# Patient Record
Sex: Female | Born: 1935 | Race: White | Hispanic: No | Smoking: Never smoker
Health system: Southern US, Community
[De-identification: ages and names within clinical notes are randomized; demographics above are authoritative.]

## PROBLEM LIST (undated history)

## (undated) DIAGNOSIS — E876 Hypokalemia: Secondary | ICD-10-CM

## (undated) DIAGNOSIS — I10 Essential (primary) hypertension: Secondary | ICD-10-CM

## (undated) DIAGNOSIS — F0390 Unspecified dementia without behavioral disturbance: Secondary | ICD-10-CM

## (undated) DIAGNOSIS — G629 Polyneuropathy, unspecified: Secondary | ICD-10-CM

## (undated) DIAGNOSIS — F0280 Dementia in other diseases classified elsewhere without behavioral disturbance: Secondary | ICD-10-CM

## (undated) DIAGNOSIS — M1990 Unspecified osteoarthritis, unspecified site: Secondary | ICD-10-CM

## (undated) DIAGNOSIS — G309 Alzheimer's disease, unspecified: Secondary | ICD-10-CM

## (undated) DIAGNOSIS — E079 Disorder of thyroid, unspecified: Secondary | ICD-10-CM

---

## 1998-08-01 ENCOUNTER — Encounter: Payer: Self-pay | Admitting: Urology

## 1998-08-01 ENCOUNTER — Inpatient Hospital Stay (HOSPITAL_COMMUNITY): Admission: EM | Admit: 1998-08-01 | Discharge: 1998-08-06 | Payer: Self-pay | Admitting: *Deleted

## 1998-08-03 ENCOUNTER — Encounter: Payer: Self-pay | Admitting: Urology

## 1998-08-04 ENCOUNTER — Encounter: Payer: Self-pay | Admitting: Cardiovascular Disease

## 1998-08-06 ENCOUNTER — Encounter: Payer: Self-pay | Admitting: Interventional Cardiology

## 1998-09-12 ENCOUNTER — Encounter: Payer: Self-pay | Admitting: Gastroenterology

## 1998-09-12 ENCOUNTER — Ambulatory Visit (HOSPITAL_COMMUNITY): Admission: RE | Admit: 1998-09-12 | Discharge: 1998-09-12 | Payer: Self-pay | Admitting: Gastroenterology

## 1999-07-21 ENCOUNTER — Ambulatory Visit (HOSPITAL_COMMUNITY): Admission: RE | Admit: 1999-07-21 | Discharge: 1999-07-21 | Payer: Self-pay

## 1999-09-09 ENCOUNTER — Encounter: Payer: Self-pay | Admitting: Urology

## 1999-09-11 ENCOUNTER — Encounter: Payer: Self-pay | Admitting: Urology

## 1999-09-11 ENCOUNTER — Ambulatory Visit (HOSPITAL_COMMUNITY): Admission: RE | Admit: 1999-09-11 | Discharge: 1999-09-11 | Payer: Self-pay | Admitting: Urology

## 2000-11-01 ENCOUNTER — Ambulatory Visit (HOSPITAL_COMMUNITY): Admission: RE | Admit: 2000-11-01 | Discharge: 2000-11-01 | Payer: Self-pay | Admitting: Gastroenterology

## 2001-01-28 ENCOUNTER — Ambulatory Visit (HOSPITAL_COMMUNITY): Admission: RE | Admit: 2001-01-28 | Discharge: 2001-01-28 | Payer: Self-pay | Admitting: Orthopedic Surgery

## 2001-05-31 ENCOUNTER — Encounter: Admission: RE | Admit: 2001-05-31 | Discharge: 2001-05-31 | Payer: Self-pay | Admitting: Internal Medicine

## 2001-05-31 ENCOUNTER — Encounter: Payer: Self-pay | Admitting: Internal Medicine

## 2001-07-19 ENCOUNTER — Other Ambulatory Visit: Admission: RE | Admit: 2001-07-19 | Discharge: 2001-07-19 | Payer: Self-pay | Admitting: Gynecology

## 2001-09-27 ENCOUNTER — Emergency Department (HOSPITAL_COMMUNITY): Admission: EM | Admit: 2001-09-27 | Discharge: 2001-09-27 | Payer: Self-pay | Admitting: Emergency Medicine

## 2002-06-26 ENCOUNTER — Ambulatory Visit (HOSPITAL_COMMUNITY): Admission: RE | Admit: 2002-06-26 | Discharge: 2002-06-26 | Payer: Self-pay | Admitting: Gastroenterology

## 2003-07-25 ENCOUNTER — Other Ambulatory Visit: Admission: RE | Admit: 2003-07-25 | Discharge: 2003-07-25 | Payer: Self-pay | Admitting: Obstetrics and Gynecology

## 2003-11-13 ENCOUNTER — Emergency Department (HOSPITAL_COMMUNITY): Admission: EM | Admit: 2003-11-13 | Discharge: 2003-11-13 | Payer: Self-pay | Admitting: Emergency Medicine

## 2004-02-04 ENCOUNTER — Emergency Department: Payer: Self-pay | Admitting: Emergency Medicine

## 2004-02-04 ENCOUNTER — Other Ambulatory Visit: Payer: Self-pay

## 2004-05-08 ENCOUNTER — Encounter: Admission: RE | Admit: 2004-05-08 | Discharge: 2004-05-08 | Payer: Self-pay | Admitting: Internal Medicine

## 2004-12-07 ENCOUNTER — Emergency Department (HOSPITAL_COMMUNITY): Admission: EM | Admit: 2004-12-07 | Discharge: 2004-12-07 | Payer: Self-pay | Admitting: Emergency Medicine

## 2005-04-26 ENCOUNTER — Ambulatory Visit: Payer: Self-pay | Admitting: Physical Medicine & Rehabilitation

## 2005-04-26 ENCOUNTER — Inpatient Hospital Stay (HOSPITAL_COMMUNITY): Admission: EM | Admit: 2005-04-26 | Discharge: 2005-04-30 | Payer: Self-pay | Admitting: Emergency Medicine

## 2007-11-17 ENCOUNTER — Inpatient Hospital Stay: Payer: Self-pay | Admitting: *Deleted

## 2007-11-17 ENCOUNTER — Other Ambulatory Visit: Payer: Self-pay

## 2009-01-31 ENCOUNTER — Emergency Department: Payer: Self-pay | Admitting: Unknown Physician Specialty

## 2009-12-30 ENCOUNTER — Inpatient Hospital Stay: Payer: Self-pay | Admitting: *Deleted

## 2010-07-14 ENCOUNTER — Emergency Department: Payer: Self-pay | Admitting: Emergency Medicine

## 2010-07-28 ENCOUNTER — Ambulatory Visit: Payer: Self-pay | Admitting: Otolaryngology

## 2010-08-25 ENCOUNTER — Emergency Department: Payer: Self-pay | Admitting: Emergency Medicine

## 2011-03-24 ENCOUNTER — Emergency Department: Payer: Self-pay | Admitting: Unknown Physician Specialty

## 2011-09-09 ENCOUNTER — Emergency Department: Payer: Self-pay

## 2011-09-09 LAB — URINALYSIS, COMPLETE
Bilirubin,UR: NEGATIVE
Blood: NEGATIVE
Ketone: NEGATIVE
Leukocyte Esterase: NEGATIVE
Nitrite: NEGATIVE
Ph: 6 (ref 4.5–8.0)
Protein: NEGATIVE
RBC,UR: <1 /HPF (ref 0–5)
Specific Gravity: 1.019 (ref 1.003–1.030)
Squamous Epithelial: 4
WBC UR: 2 /HPF (ref 0–5)

## 2011-09-10 ENCOUNTER — Emergency Department: Payer: Self-pay | Admitting: Internal Medicine

## 2011-09-10 LAB — COMPREHENSIVE METABOLIC PANEL
Anion Gap: 10 (ref 7–16)
Calcium, Total: 9 mg/dL (ref 8.5–10.1)
Creatinine: 1.21 mg/dL (ref 0.60–1.30)
Osmolality: 275 (ref 275–301)
Potassium: 3.1 mmol/L — ABNORMAL LOW (ref 3.5–5.1)
SGOT(AST): 42 U/L — ABNORMAL HIGH (ref 15–37)
SGPT (ALT): 30 U/L
Sodium: 135 mmol/L — ABNORMAL LOW (ref 136–145)
Total Protein: 7.1 g/dL (ref 6.4–8.2)

## 2011-09-10 LAB — URINALYSIS, COMPLETE
Bilirubin,UR: NEGATIVE
Blood: NEGATIVE
Glucose,UR: NEGATIVE mg/dL (ref 0–75)
Ketone: NEGATIVE
Protein: NEGATIVE
Renal Epithelial: <1
Specific Gravity: 1.017 (ref 1.003–1.030)
Squamous Epithelial: 2
Transitional Epi: 2
WBC UR: 2 /HPF (ref 0–5)

## 2011-09-10 LAB — CBC
HCT: 40.3 % (ref 35.0–47.0)
HGB: 13.8 g/dL (ref 12.0–16.0)
MCH: 30.9 pg (ref 26.0–34.0)
MCHC: 34.2 g/dL (ref 32.0–36.0)
MCV: 90 fL (ref 80–100)
Platelet: 175 10*3/uL (ref 150–440)
WBC: 9.1 10*3/uL (ref 3.6–11.0)

## 2012-05-17 ENCOUNTER — Emergency Department (HOSPITAL_COMMUNITY)
Admission: EM | Admit: 2012-05-17 | Discharge: 2012-05-17 | Disposition: A | Discharge status: Home / Self Care | Payer: Medicare Other | Attending: Emergency Medicine | Admitting: Emergency Medicine

## 2012-05-17 ENCOUNTER — Encounter (HOSPITAL_COMMUNITY): Payer: Self-pay | Admitting: *Deleted

## 2012-05-17 ENCOUNTER — Emergency Department (HOSPITAL_COMMUNITY): Payer: Medicare Other

## 2012-05-17 DIAGNOSIS — Z862 Personal history of diseases of the blood and blood-forming organs and certain disorders involving the immune mechanism: Secondary | ICD-10-CM | POA: Insufficient documentation

## 2012-05-17 DIAGNOSIS — I10 Essential (primary) hypertension: Secondary | ICD-10-CM | POA: Insufficient documentation

## 2012-05-17 DIAGNOSIS — S41119A Laceration without foreign body of unspecified upper arm, initial encounter: Secondary | ICD-10-CM

## 2012-05-17 DIAGNOSIS — G609 Hereditary and idiopathic neuropathy, unspecified: Secondary | ICD-10-CM | POA: Insufficient documentation

## 2012-05-17 DIAGNOSIS — Y9210 Unspecified residential institution as the place of occurrence of the external cause: Secondary | ICD-10-CM | POA: Insufficient documentation

## 2012-05-17 DIAGNOSIS — S0003XA Contusion of scalp, initial encounter: Secondary | ICD-10-CM | POA: Insufficient documentation

## 2012-05-17 DIAGNOSIS — W010XXA Fall on same level from slipping, tripping and stumbling without subsequent striking against object, initial encounter: Secondary | ICD-10-CM | POA: Insufficient documentation

## 2012-05-17 DIAGNOSIS — G309 Alzheimer's disease, unspecified: Secondary | ICD-10-CM | POA: Insufficient documentation

## 2012-05-17 DIAGNOSIS — Z8639 Personal history of other endocrine, nutritional and metabolic disease: Secondary | ICD-10-CM | POA: Insufficient documentation

## 2012-05-17 DIAGNOSIS — S41109A Unspecified open wound of unspecified upper arm, initial encounter: Secondary | ICD-10-CM | POA: Insufficient documentation

## 2012-05-17 DIAGNOSIS — W19XXXA Unspecified fall, initial encounter: Secondary | ICD-10-CM

## 2012-05-17 DIAGNOSIS — S1093XA Contusion of unspecified part of neck, initial encounter: Secondary | ICD-10-CM | POA: Insufficient documentation

## 2012-05-17 DIAGNOSIS — E079 Disorder of thyroid, unspecified: Secondary | ICD-10-CM | POA: Insufficient documentation

## 2012-05-17 DIAGNOSIS — Z79899 Other long term (current) drug therapy: Secondary | ICD-10-CM | POA: Insufficient documentation

## 2012-05-17 DIAGNOSIS — IMO0002 Reserved for concepts with insufficient information to code with codable children: Secondary | ICD-10-CM | POA: Insufficient documentation

## 2012-05-17 DIAGNOSIS — S0083XA Contusion of other part of head, initial encounter: Secondary | ICD-10-CM

## 2012-05-17 DIAGNOSIS — Z8739 Personal history of other diseases of the musculoskeletal system and connective tissue: Secondary | ICD-10-CM | POA: Insufficient documentation

## 2012-05-17 DIAGNOSIS — Y9389 Activity, other specified: Secondary | ICD-10-CM | POA: Insufficient documentation

## 2012-05-17 DIAGNOSIS — F0280 Dementia in other diseases classified elsewhere without behavioral disturbance: Secondary | ICD-10-CM | POA: Insufficient documentation

## 2012-05-17 HISTORY — DX: Unspecified osteoarthritis, unspecified site: M19.90

## 2012-05-17 HISTORY — DX: Dementia in other diseases classified elsewhere, unspecified severity, without behavioral disturbance, psychotic disturbance, mood disturbance, and anxiety: F02.80

## 2012-05-17 HISTORY — DX: Alzheimer's disease, unspecified: G30.9

## 2012-05-17 HISTORY — DX: Hypokalemia: E87.6

## 2012-05-17 HISTORY — DX: Unspecified dementia, unspecified severity, without behavioral disturbance, psychotic disturbance, mood disturbance, and anxiety: F03.90

## 2012-05-17 HISTORY — DX: Essential (primary) hypertension: I10

## 2012-05-17 HISTORY — DX: Disorder of thyroid, unspecified: E07.9

## 2012-05-17 HISTORY — DX: Polyneuropathy, unspecified: G62.9

## 2012-05-17 LAB — COMPREHENSIVE METABOLIC PANEL
AST: 20 U/L (ref 0–37)
Albumin: 2.8 g/dL — ABNORMAL LOW (ref 3.5–5.2)
Chloride: 106 mEq/L (ref 96–112)
Creatinine, Ser: 0.8 mg/dL (ref 0.50–1.10)
Potassium: 4.5 mEq/L (ref 3.5–5.1)
Total Bilirubin: 0.2 mg/dL — ABNORMAL LOW (ref 0.3–1.2)

## 2012-05-17 LAB — CBC WITH DIFFERENTIAL/PLATELET
Basophils Absolute: 0 10*3/uL (ref 0.0–0.1)
Basophils Relative: 0 % (ref 0–1)
MCHC: 33.2 g/dL (ref 30.0–36.0)
Neutro Abs: 8.2 10*3/uL — ABNORMAL HIGH (ref 1.7–7.7)
Neutrophils Relative %: 84 % — ABNORMAL HIGH (ref 43–77)
Platelets: 201 10*3/uL (ref 150–400)
RDW: 14.2 % (ref 11.5–15.5)

## 2012-05-17 NOTE — ED Notes (Signed)
[facility] called to take pt back to [facility].  

## 2012-05-17 NOTE — ED Provider Notes (Signed)
History     CSN: [id number]  Arrival date & time 05/17/12  1332   First MD Initiated Contact with Patient 05/17/12 1502      Chief Complaint  Patient presents with  . Fall    (Consider location/radiation/quality/duration/timing/severity/associated sxs/prior treatment) Patient is a 79 y.o. female presenting with fall. The history is provided by the patient and the nursing home.  Fall The accident occurred yesterday. Incident: pt states she tripped and fell and hit her head. She fell from a height of 1 to 2 ft. She landed on a hard floor. There was no blood loss. Point of impact: right arm and left side of face. The pain is at a severity of 3/10. The pain is mild. She was ambulatory at the scene. Pertinent negatives include no visual change, no numbness, no abdominal pain, no nausea, no vomiting, no headaches, no loss of consciousness and no tingling. The symptoms are aggravated by use of the injured limb. She has tried nothing for the symptoms. The treatment provided no relief.    Past Medical History  Diagnosis Date  . Dementia   . Alzheimer's dementia   . Thyroid disease   . Hypertension   . Hypokalemia   . DJD (degenerative joint disease)   . Peripheral neuropathy     History reviewed. No pertinent past surgical history.  History reviewed. No pertinent family history.  History  Substance Use Topics  . Smoking status: Never Smoker   . Smokeless tobacco: Never Used  . Alcohol Use: No    OB History    Grav Para Term Preterm Abortions TAB SAB Ect Mult Living                  Review of Systems  Gastrointestinal: Negative for nausea, vomiting and abdominal pain.  Neurological: Negative for tingling, loss of consciousness, numbness and headaches.  All other systems reviewed and are negative.    Allergies  Review of patient's allergies indicates no known allergies.  Home Medications   Current Outpatient Rx  Name  Route  Sig  Dispense  Refill  . ACETAMINOPHEN  500 MG PO TABS   Oral   Take 500 mg by mouth every 6 (six) hours as needed. PAIN         . ATENOLOL 50 MG PO TABS   Oral   Take 50 mg by mouth daily.         Marland Kitchen CETIRIZINE HCL 10 MG PO TABS   Oral   Take 10 mg by mouth daily.         Marland Kitchen DIVALPROEX SODIUM 125 MG PO TBEC   Oral   Take 125-250 mg by mouth daily. Pt takes 1 tablet every morning and 2 tablets every evening         . ESCITALOPRAM OXALATE 10 MG PO TABS   Oral   Take 10 mg by mouth daily.         Marland Kitchen GABAPENTIN 600 MG PO TABS   Oral   Take 600 mg by mouth daily.         Marland Kitchen LEVOTHYROXINE SODIUM 50 MCG PO TABS   Oral   Take 50 mcg by mouth daily.         Marland Kitchen OMEPRAZOLE 20 MG PO CPDR   Oral   Take 20 mg by mouth daily.         Marland Kitchen PREDNISONE 20 MG PO TABS   Oral   Take 20 mg by mouth daily. Pt  to take for 4 days.         Marland Kitchen PRESCRIPTION MEDICATION   Topical   Apply 1 application topically 2 (two) times daily. TAC 0.025% EUCERIN 1:1         . RIVASTIGMINE 4.6 MG/24HR TD PT24   Transdermal   Place 1 patch onto the skin daily.         . SENNA-DOCUSATE SODIUM 8.6-50 MG PO TABS   Oral   Take 1 tablet by mouth daily.         . SULFAMETHOXAZOLE-TRIMETHOPRIM 800-160 MG PO TABS   Oral   Take 1 tablet by mouth 2 (two) times daily. FOR 10 DAYS. FINISHED ON 05-08-12         . TRAZODONE HCL 50 MG PO TABS   Oral   Take 25 mg by mouth at bedtime.           BP 131/45  Pulse 61  Temp 98.1 F (36.7 C) (Oral)  Resp 16  SpO2 100%  Physical Exam  Nursing note and vitals reviewed. Constitutional: She is oriented to person, place, and time. She appears well-developed and well-nourished. No distress.  HENT:  Head: Normocephalic. Head is with contusion.    Right Ear: Tympanic membrane and ear canal normal.  Left Ear: Tympanic membrane and ear canal normal.  Mouth/Throat: Oropharynx is clear and moist.  Eyes: Conjunctivae normal and EOM are normal. Pupils are equal, round, and reactive to light.   Neck: Normal range of motion. Neck supple.  Cardiovascular: Normal rate, regular rhythm and intact distal pulses.   No murmur heard. Pulmonary/Chest: Effort normal and breath sounds normal. No respiratory distress. She has no wheezes. She has no rales.  Abdominal: Soft. She exhibits no distension. There is no tenderness. There is no rebound and no guarding.  Musculoskeletal: Normal range of motion. She exhibits tenderness. She exhibits no edema.       Arms: Neurological: She is alert and oriented to person, place, and time.  Skin: Skin is warm and dry. No rash noted. No erythema.  Psychiatric: She has a normal mood and affect. Her behavior is normal.    ED Course  Procedures (including critical care time)  Labs Reviewed  CBC WITH DIFFERENTIAL - Abnormal; Notable for the following:    Neutrophils Relative 84 (*)     Neutro Abs 8.2 (*)     Lymphocytes Relative 11 (*)     All other components within normal limits  COMPREHENSIVE METABOLIC PANEL - Abnormal; Notable for the following:    Glucose, Bld 141 (*)     Albumin 2.8 (*)     Total Bilirubin 0.2 (*)     GFR calc non Af Amer 63 (*)     GFR calc Af Amer 72 (*)     All other components within normal limits   Ct Head Wo Contrast  05/17/2012  *RADIOLOGY REPORT*  Clinical Data: Fall  CT HEAD WITHOUT CONTRAST  Technique:  Contiguous axial images were obtained from the base of the skull through the vertex without contrast.  Comparison: 12/07/2004  Findings: No skull fracture is noted.  There is focal scalp swelling left frontal region.  Small subcutaneous hematoma left scalp measures 1.4 cm.  No intracranial hemorrhage, mass effect or midline shift.  The paranasal sinuses and mastoid air cells are unremarkable.  Mild atherosclerotic calcifications of the carotid siphon.  Moderate cerebral atrophy.  Periventricular and subcortical white matter decreased attenuation is probable due to chronic small vessel  ischemic changes.  No acute infarction.   No mass lesion is noted on this unenhanced scan.  IMPRESSION:  1.  No acute intracranial abnormality. 2.  Scalp stranding and small subcutaneous hematoma in the left frontal scalp. 3.  Moderate cerebral atrophy.  Periventricular and subcortical white matter decreased attenuation probable due to chronic small vessel ischemic changes.   Original Report Authenticated By: Natasha Mead, M.D.      No diagnosis found.    MDM   Patient with a mechanical fall yesterday and sent him by the nursing home for evaluation. Patient has ecchymosis over her left eye but is awake and alert and otherwise acting normally. She has no pain in her neck and has a small skin tear to her right upper arm but the rest of exam is unrevealing for injury. She is not on anticoagulation and head CT is negative. Patient had labs drawn prior to my arrival which are normal. Will discharge patient home        Gwyneth Sprout, MD 05/17/12 1645

## 2012-05-17 NOTE — ED Notes (Signed)
[id number]> Expected date:<BR> Expected time:<BR> Means of arrival:Ambulance<BR> Comments:<BR> 89yof- fall/bruise to left eye

## 2012-05-17 NOTE — ED Notes (Signed)
Per EMS: Bethany Mcfarland last night, bump on left eye(face). No other complaints but pt is demented.

## 2012-05-17 NOTE — ED Notes (Signed)
Patient transported to CT 

## 2012-05-17 NOTE — Progress Notes (Signed)
CSW confirmed patient returning to [facility]. .No further Clinical Social Work needs, signing off.   Catha Gosselin, LCSWA  [phone] .05/17/2012 1725pm

## 2012-05-17 NOTE — ED Notes (Signed)
[facility] arrived to pick pt up.

## 2012-05-17 NOTE — ED Notes (Signed)
Attempted to call report to [facility], but got a recording on answering machine.

## 2012-06-19 ENCOUNTER — Emergency Department (HOSPITAL_COMMUNITY): Payer: Medicare Other

## 2012-06-19 ENCOUNTER — Encounter (HOSPITAL_COMMUNITY): Payer: Self-pay

## 2012-06-19 ENCOUNTER — Emergency Department (HOSPITAL_COMMUNITY)
Admission: EM | Admit: 2012-06-19 | Discharge: 2012-06-19 | Disposition: A | Discharge status: Skilled Nursing Facility | Payer: Medicare Other | Attending: Emergency Medicine | Admitting: Emergency Medicine

## 2012-06-19 DIAGNOSIS — Z8639 Personal history of other endocrine, nutritional and metabolic disease: Secondary | ICD-10-CM | POA: Insufficient documentation

## 2012-06-19 DIAGNOSIS — F0280 Dementia in other diseases classified elsewhere without behavioral disturbance: Secondary | ICD-10-CM | POA: Insufficient documentation

## 2012-06-19 DIAGNOSIS — W010XXA Fall on same level from slipping, tripping and stumbling without subsequent striking against object, initial encounter: Secondary | ICD-10-CM | POA: Insufficient documentation

## 2012-06-19 DIAGNOSIS — G309 Alzheimer's disease, unspecified: Secondary | ICD-10-CM | POA: Insufficient documentation

## 2012-06-19 DIAGNOSIS — Y9301 Activity, walking, marching and hiking: Secondary | ICD-10-CM | POA: Insufficient documentation

## 2012-06-19 DIAGNOSIS — Z8739 Personal history of other diseases of the musculoskeletal system and connective tissue: Secondary | ICD-10-CM | POA: Insufficient documentation

## 2012-06-19 DIAGNOSIS — I10 Essential (primary) hypertension: Secondary | ICD-10-CM | POA: Insufficient documentation

## 2012-06-19 DIAGNOSIS — S82899A Other fracture of unspecified lower leg, initial encounter for closed fracture: Secondary | ICD-10-CM | POA: Insufficient documentation

## 2012-06-19 DIAGNOSIS — Z862 Personal history of diseases of the blood and blood-forming organs and certain disorders involving the immune mechanism: Secondary | ICD-10-CM | POA: Insufficient documentation

## 2012-06-19 DIAGNOSIS — Y929 Unspecified place or not applicable: Secondary | ICD-10-CM | POA: Insufficient documentation

## 2012-06-19 DIAGNOSIS — E039 Hypothyroidism, unspecified: Secondary | ICD-10-CM | POA: Insufficient documentation

## 2012-06-19 DIAGNOSIS — Z8669 Personal history of other diseases of the nervous system and sense organs: Secondary | ICD-10-CM | POA: Insufficient documentation

## 2012-06-19 DIAGNOSIS — IMO0002 Reserved for concepts with insufficient information to code with codable children: Secondary | ICD-10-CM | POA: Insufficient documentation

## 2012-06-19 DIAGNOSIS — Z79899 Other long term (current) drug therapy: Secondary | ICD-10-CM | POA: Insufficient documentation

## 2012-06-19 DIAGNOSIS — S59909A Unspecified injury of unspecified elbow, initial encounter: Secondary | ICD-10-CM | POA: Insufficient documentation

## 2012-06-19 DIAGNOSIS — S6990XA Unspecified injury of unspecified wrist, hand and finger(s), initial encounter: Secondary | ICD-10-CM | POA: Insufficient documentation

## 2012-06-19 LAB — URINALYSIS, ROUTINE W REFLEX MICROSCOPIC
Ketones, ur: NEGATIVE mg/dL
Leukocytes, UA: NEGATIVE
Nitrite: NEGATIVE
Specific Gravity, Urine: 1.008 (ref 1.005–1.030)
pH: 6.5 (ref 5.0–8.0)

## 2012-06-19 LAB — POCT I-STAT, CHEM 8
Calcium, Ion: 1.19 mmol/L (ref 1.13–1.30)
Chloride: 106 mEq/L (ref 96–112)
HCT: 37 % (ref 36.0–46.0)
Potassium: 4 mEq/L (ref 3.5–5.1)
Sodium: 140 mEq/L (ref 135–145)

## 2012-06-19 LAB — CBC
MCH: 30.4 pg (ref 26.0–34.0)
MCV: 91.5 fL (ref 78.0–100.0)
Platelets: 164 10*3/uL (ref 150–400)
RBC: 3.98 MIL/uL (ref 3.87–5.11)

## 2012-06-19 MED ORDER — HYDROCODONE-ACETAMINOPHEN 5-325 MG PO TABS: 1.0000 | ORAL_TABLET | Freq: Four times a day (QID) | ORAL | Status: DC | PRN

## 2012-06-19 MED ORDER — FENTANYL CITRATE 0.05 MG/ML IJ SOLN
25.0000 ug | INTRAMUSCULAR | Status: DC | PRN
  Administered 2012-06-19: 25 ug via INTRAVENOUS
  Filled 2012-06-19: qty 2

## 2012-06-19 MED ORDER — ONDANSETRON HCL 4 MG/2ML IJ SOLN
4.0000 mg | Freq: Once | INTRAMUSCULAR | Status: AC
  Administered 2012-06-19: 4 mg via INTRAVENOUS
  Filled 2012-06-19: qty 2

## 2012-06-19 MED ORDER — SODIUM CHLORIDE 0.9 % IV SOLN: INTRAVENOUS | Status: DC

## 2012-06-19 NOTE — ED Notes (Addendum)
CALLED [facility]- ANSWERING machine came on. Will make [facility] aware. Day shift nurse will pass on report to ensure that no weight to left leg and must seek ortho service Dr Rayburn Ma on Monday for further treatment.

## 2012-06-19 NOTE — ED Notes (Signed)
Per EMS, pt from [facility].  Pt was ambulating to bathroom while urinating at same time.  Pt slipped in urine and fell to her bottom.  Fall unwitnessed.  Pt c/o pain in tailbone, left ankle, and has abrasion to left elbow.  No bumps/bruising to head.  LOC unknown d/t timeframe of alert.  No obvious head injury noted.  Vitals:  190/74, hr 66 (?irregular), resp 16, sats 99%.  HX HTN, Gerd, Dementia, hypothyroid (no blood thinners)

## 2012-06-19 NOTE — ED Notes (Signed)
[facility] [employer] notified of need for transport back to [facility], spoke w/ Harrold Donath. Informed pt would need to be non-weight bearing.

## 2012-06-19 NOTE — ED Notes (Signed)
ZOX:[id number]<AV> Expected date:<BR> Expected time: 2:30 AM<BR> Means of arrival:<BR> Comments:<BR> [age]F fall butt, ankle, elbow pain

## 2012-06-19 NOTE — ED Notes (Signed)
Dr. Opitz at bedside. 

## 2012-06-19 NOTE — ED Provider Notes (Signed)
History     CSN: [id number]  Arrival date & time 06/19/12  0234   First MD Initiated Contact with Patient 06/19/12 [phone]      Chief Complaint  Patient presents with  . Fall  . Tailbone Pain  . Ankle Pain    (Consider location/radiation/quality/duration/timing/severity/associated sxs/prior treatment) HPI History provided by EMS, nursing home report and patient. Resides in nursing facility. Was walking to the bathroom tonight slipped and injured her tailbone and left ankle. She also sustained abrasion to left elbow. She denies hitting her head or hurting her neck. Pain is mostly in her ankle is moderate in severity and sharp in quality. Patient is a limited historian with history of dementia.  She recalls falling and the incident tonight.   Past Medical History  Diagnosis Date  . Dementia   . Alzheimer's dementia   . Thyroid disease   . Hypertension   . Hypokalemia   . DJD (degenerative joint disease)   . Peripheral neuropathy     History reviewed. No pertinent past surgical history.  History reviewed. No pertinent family history.  History  Substance Use Topics  . Smoking status: Never Smoker   . Smokeless tobacco: Never Used  . Alcohol Use: No    OB History   Grav Para Term Preterm Abortions TAB SAB Ect Mult Living                  Review of Systems  Constitutional: Negative for fever and chills.  HENT: Negative for neck pain and neck stiffness.   Eyes: Negative for visual disturbance.  Respiratory: Negative for shortness of breath.   Cardiovascular: Negative for chest pain.  Gastrointestinal: Negative for abdominal pain.  Genitourinary: Negative for dysuria.  Musculoskeletal: Negative for back pain.  Skin: Negative for rash.  Neurological: Negative for headaches.  All other systems reviewed and are negative.    Allergies  Review of patient's allergies indicates no known allergies.  Home Medications   Current Outpatient Rx  Name  Route  Sig  Dispense   Refill  . atenolol (TENORMIN) 50 MG tablet   Oral   Take 50 mg by mouth every morning.          . divalproex (DEPAKOTE) 125 MG DR tablet   Oral   Take 125-250 mg by mouth daily. Take 1 tablet every morning and 2 tablets every evening         . escitalopram (LEXAPRO) 10 MG tablet   Oral   Take 10 mg by mouth every morning.          . gabapentin (NEURONTIN) 600 MG tablet   Oral   Take 600 mg by mouth every morning.          Marland Kitchen levothyroxine (SYNTHROID, LEVOTHROID) 50 MCG tablet   Oral   Take 50 mcg by mouth every morning.          Marland Kitchen omeprazole (PRILOSEC) 20 MG capsule   Oral   Take 20 mg by mouth daily.         . predniSONE (DELTASONE) 20 MG tablet   Oral   Take 20 mg by mouth every morning.          . rivastigmine (EXELON) 4.6 mg/24hr   Transdermal   Place 1 patch onto the skin every morning.          . traZODone (DESYREL) 50 MG tablet   Oral   Take 25 mg by mouth at bedtime.         Marland Kitchen  triamcinolone cream (KENALOG) 0.1 %   Topical   Apply 1 application topically every 8 (eight) hours. Apply to lower extremities every 8 hours           BP 182/45  Pulse 79  Temp(Src) 98 F (36.7 C) (Oral)  Resp 18  SpO2 96%  Physical Exam  Constitutional: She is oriented to person, place, and time. She appears well-developed and well-nourished.  HENT:  Head: Normocephalic and atraumatic.  Mouth/Throat: Oropharynx is clear and moist. No oropharyngeal exudate.  Eyes: EOM are normal. Pupils are equal, round, and reactive to light.  Neck: Neck supple.  No C-spine tenderness  Cardiovascular: Normal rate, regular rhythm and intact distal pulses.   Pulmonary/Chest: Effort normal and breath sounds normal. No respiratory distress. She exhibits no tenderness.  Abdominal: Soft. Bowel sounds are normal. She exhibits no distension. There is no tenderness.  Musculoskeletal:  Left elbow abrasion with full range of motion and distal neurovascular intact. Left ankle  swelling and tenderness to lateral malleolus with skin intact and distal neurovascular intact. No tenderness over knee, hip or proximal fibula.  Neurological: She is alert and oriented to person, place, and time.  Skin: Skin is warm and dry.    ED Course  Procedures (including critical care time)  Results for orders placed during the hospital encounter of 06/19/12  CBC      Result Value Range   WBC 8.9  4.0 - 10.5 K/uL   RBC 3.98  3.87 - 5.11 MIL/uL   Hemoglobin 12.1  12.0 - 15.0 g/dL   HCT 91.4  78.2 - 95.6 %   MCV 91.5  78.0 - 100.0 fL   MCH 30.4  26.0 - 34.0 pg   MCHC 33.2  30.0 - 36.0 g/dL   RDW 21.3  08.6 - 57.8 %   Platelets 164  150 - 400 K/uL  URINALYSIS, ROUTINE W REFLEX MICROSCOPIC      Result Value Range   Color, Urine YELLOW  YELLOW   APPearance CLOUDY (*) CLEAR   Specific Gravity, Urine 1.008  1.005 - 1.030   pH 6.5  5.0 - 8.0   Glucose, UA NEGATIVE  NEGATIVE mg/dL   Hgb urine dipstick NEGATIVE  NEGATIVE   Bilirubin Urine NEGATIVE  NEGATIVE   Ketones, ur NEGATIVE  NEGATIVE mg/dL   Protein, ur NEGATIVE  NEGATIVE mg/dL   Urobilinogen, UA 0.2  0.0 - 1.0 mg/dL   Nitrite NEGATIVE  NEGATIVE   Leukocytes, UA NEGATIVE  NEGATIVE  POCT I-STAT, CHEM 8      Result Value Range   Sodium 140  135 - 145 mEq/L   Potassium 4.0  3.5 - 5.1 mEq/L   Chloride 106  96 - 112 mEq/L   BUN 15  6 - 23 mg/dL   Creatinine, Ser 4.69  0.50 - 1.10 mg/dL   Glucose, Bld 90  70 - 99 mg/dL   Calcium, Ion 6.29  5.28 - 1.30 mmol/L   TCO2 23  0 - 100 mmol/L   Hemoglobin 12.6  12.0 - 15.0 g/dL   HCT 41.3  24.4 - 01.0 %   Dg Lumbar Spine Complete  06/19/2012  *RADIOLOGY REPORT*  Clinical Data: Low back pain after fall.  LUMBAR SPINE - COMPLETE 4+ VIEW  Comparison: 12/07/2004  Findings: Five lumbar type vertebrae.  Degenerative changes in the lumbar spine with narrowed lumbar interspaces and associated endplate hypertrophic changes.  Degenerative changes in the facet joints.  Normal alignment of the  lumbar vertebrae  and facet joints. No vertebral compression deformities.  No focal bone lesion or bone destruction.  Bone cortex and trabecular architecture appear intact.  Vascular calcifications.  IMPRESSION: Degenerative changes in the lumbar spine.  No displaced fractures identified.   Original Report Authenticated By: Burman Nieves, M.D.    Dg Elbow Complete Left  06/19/2012  *RADIOLOGY REPORT*  Clinical Data: Posterior laceration and pain after fall.  LEFT ELBOW - COMPLETE 3+ VIEW  Comparison: None.  Findings: Soft tissue defects consistent with lacerations posterior to the olecranon.  Small bone fragments over the olecranon process may represent avulsion fragments.  No significant effusion.  No displaced fractures of the elbow demonstrated.  No focal bone lesion or bone destruction.  IMPRESSION: Soft tissue lacerations over the olecranon region of the left elbow.  Tiny bone fragments off of the olecranon suggest avulsion fractures.  No displaced fracture or effusion.   Original Report Authenticated By: Burman Nieves, M.D.    Dg Ankle Complete Left  06/19/2012  *RADIOLOGY REPORT*  Clinical Data: Pain and swelling of the left ankle after fall.  LEFT ANKLE COMPLETE - 3+ VIEW  Comparison: None.  Findings: Diffuse soft tissue swelling about the left ankle, greatest laterally.  There is a mostly transverse fracture of the distal left fibula.  Transverse fracture of the medial malleolus. Small chip fracture off of the posterior malleolus.  There is slight widening of the medial aspect of the tibiotalar joint suggesting ligamentous injury.  Bone fragment suggested off of the lateral aspect of the calcaneus or cuboidal bone may also represent avulsion fracture.  Degenerative changes in the tarsal joints.  IMPRESSION: Trimalleolar fractures of the left ankle with widening of the medial tibiotalar joint space consistent with ligamentous injury. Probable avulsion fracture off of the lateral aspect of the  calcaneus or cuboidal bone.   Original Report Authenticated By: Burman Nieves, M.D.      Date: 06/19/2012  Rate: 70  Rhythm: normal sinus rhythm  QRS Axis: normal  Intervals: normal  ST/T Wave abnormalities: nonspecific ST changes  Conduction Disutrbances:none  Narrative Interpretation:   Old EKG Reviewed: unchanged   4:57 AM discussed with Dr. Magnus Ivan on call for orthopedics. Given history of dementia recommend well-padded splint and followup in the office on Monday.   On recheck pain is improved. Still for discharge back to nursing facility with no weightbearing to left lower extremity - followup orthopedic clinic  MDM   Fall at nursing facility with left ankle trimalar fracture, and very small avulsion fracture left elbow on imaging reviewed as above. Urinalysis and labs reviewed as above. EKG reviewed.  Vital signs and nursing notes reviewed - stable for discharge        Bethany Nielsen, MD 06/19/12 [phone]

## 2012-07-28 ENCOUNTER — Emergency Department (HOSPITAL_COMMUNITY)
Admission: EM | Admit: 2012-07-28 | Discharge: 2012-07-28 | Disposition: A | Discharge status: Skilled Nursing Facility | Payer: Medicare Other | Attending: Emergency Medicine | Admitting: Emergency Medicine

## 2012-07-28 ENCOUNTER — Emergency Department (HOSPITAL_COMMUNITY): Payer: Medicare Other

## 2012-07-28 ENCOUNTER — Encounter (HOSPITAL_COMMUNITY): Payer: Self-pay | Admitting: Emergency Medicine

## 2012-07-28 DIAGNOSIS — M25572 Pain in left ankle and joints of left foot: Secondary | ICD-10-CM

## 2012-07-28 DIAGNOSIS — Y9210 Unspecified residential institution as the place of occurrence of the external cause: Secondary | ICD-10-CM | POA: Insufficient documentation

## 2012-07-28 DIAGNOSIS — S99929A Unspecified injury of unspecified foot, initial encounter: Secondary | ICD-10-CM | POA: Insufficient documentation

## 2012-07-28 DIAGNOSIS — Y9389 Activity, other specified: Secondary | ICD-10-CM | POA: Insufficient documentation

## 2012-07-28 DIAGNOSIS — E079 Disorder of thyroid, unspecified: Secondary | ICD-10-CM | POA: Insufficient documentation

## 2012-07-28 DIAGNOSIS — M533 Sacrococcygeal disorders, not elsewhere classified: Secondary | ICD-10-CM | POA: Insufficient documentation

## 2012-07-28 DIAGNOSIS — W010XXA Fall on same level from slipping, tripping and stumbling without subsequent striking against object, initial encounter: Secondary | ICD-10-CM | POA: Insufficient documentation

## 2012-07-28 DIAGNOSIS — Z862 Personal history of diseases of the blood and blood-forming organs and certain disorders involving the immune mechanism: Secondary | ICD-10-CM | POA: Insufficient documentation

## 2012-07-28 DIAGNOSIS — F0280 Dementia in other diseases classified elsewhere without behavioral disturbance: Secondary | ICD-10-CM | POA: Insufficient documentation

## 2012-07-28 DIAGNOSIS — Z8639 Personal history of other endocrine, nutritional and metabolic disease: Secondary | ICD-10-CM | POA: Insufficient documentation

## 2012-07-28 DIAGNOSIS — I10 Essential (primary) hypertension: Secondary | ICD-10-CM | POA: Insufficient documentation

## 2012-07-28 DIAGNOSIS — S8990XA Unspecified injury of unspecified lower leg, initial encounter: Secondary | ICD-10-CM | POA: Insufficient documentation

## 2012-07-28 DIAGNOSIS — W19XXXA Unspecified fall, initial encounter: Secondary | ICD-10-CM

## 2012-07-28 DIAGNOSIS — G309 Alzheimer's disease, unspecified: Secondary | ICD-10-CM | POA: Insufficient documentation

## 2012-07-28 DIAGNOSIS — S82899A Other fracture of unspecified lower leg, initial encounter for closed fracture: Secondary | ICD-10-CM | POA: Insufficient documentation

## 2012-07-28 DIAGNOSIS — Z8739 Personal history of other diseases of the musculoskeletal system and connective tissue: Secondary | ICD-10-CM | POA: Insufficient documentation

## 2012-07-28 DIAGNOSIS — Z8669 Personal history of other diseases of the nervous system and sense organs: Secondary | ICD-10-CM | POA: Insufficient documentation

## 2012-07-28 DIAGNOSIS — Z79899 Other long term (current) drug therapy: Secondary | ICD-10-CM | POA: Insufficient documentation

## 2012-07-28 DIAGNOSIS — F0390 Unspecified dementia without behavioral disturbance: Secondary | ICD-10-CM | POA: Insufficient documentation

## 2012-07-28 NOTE — ED Notes (Signed)
[id number]<AV> Expected date:<BR> Expected time:<BR> Means of arrival:<BR> Comments:<BR> EMS-79 yo from SNF-fall trying to transfer to wheelchair-lower back pain/skin tears

## 2012-07-28 NOTE — ED Notes (Signed)
[facility]AR was called at this time.

## 2012-07-28 NOTE — ED Provider Notes (Signed)
History     CSN: [id number]  Arrival date & time 07/28/12  0112   First MD Initiated Contact with Patient 07/28/12 0124      Chief Complaint  Patient presents with  . Fall  . Ankle Pain    The history is provided by the patient.   patient was brought to the emergency department from her nursing home after a fall tonight.  She states she slipped and fell.  This occurred while she was transferring from bed to wheelchair.  She fell onto her bottom and complains of pain in the coccygeal region as well as her L-spine.  No cervical or thoracic pain reported.  No numbness or weakness of her upper lower extremities.  No head injury.  She also reports pain to her left ankle.  She was recently seen and evaluated and has a known left ankle fracture and she is post be in a fracture boot but she was not wearing this.  She denies chest pain shortness breath.  No abdominal pain.  No other complaints.  Pain is mild to moderate in severity at this time.  Past Medical History  Diagnosis Date  . Dementia   . Alzheimer's dementia   . Thyroid disease   . Hypertension   . Hypokalemia   . DJD (degenerative joint disease)   . Peripheral neuropathy     History reviewed. No pertinent past surgical history.  History reviewed. No pertinent family history.  History  Substance Use Topics  . Smoking status: Never Smoker   . Smokeless tobacco: Never Used  . Alcohol Use: No    OB History   Grav Para Term Preterm Abortions TAB SAB Ect Mult Living                  Review of Systems  All other systems reviewed and are negative.    Allergies  Review of patient's allergies indicates no known allergies.  Home Medications   Current Outpatient Rx  Name  Route  Sig  Dispense  Refill  . atenolol (TENORMIN) 50 MG tablet   Oral   Take 50 mg by mouth every morning.          . divalproex (DEPAKOTE) 125 MG DR tablet   Oral   Take 125-250 mg by mouth daily. Take 1 tablet every morning and 2 tablets  every evening         . escitalopram (LEXAPRO) 10 MG tablet   Oral   Take 10 mg by mouth every morning.          . gabapentin (NEURONTIN) 600 MG tablet   Oral   Take 600 mg by mouth every morning.          Marland Kitchen HYDROcodone-acetaminophen (NORCO/VICODIN) 5-325 MG per tablet   Oral   Take 1 tablet by mouth every 6 (six) hours as needed for pain.   6 tablet   0   . levothyroxine (SYNTHROID, LEVOTHROID) 50 MCG tablet   Oral   Take 50 mcg by mouth every morning.          Marland Kitchen lisinopril (PRINIVIL,ZESTRIL) 10 MG tablet   Oral   Take 10 mg by mouth daily.         Marland Kitchen omeprazole (PRILOSEC) 20 MG capsule   Oral   Take 40 mg by mouth daily.          . rivastigmine (EXELON) 4.6 mg/24hr   Transdermal   Place 1 patch onto the skin every morning.          Marland Kitchen  traZODone (DESYREL) 50 MG tablet   Oral   Take 25 mg by mouth at bedtime.         . triamcinolone cream (KENALOG) 0.1 %   Topical   Apply 1 application topically every 8 (eight) hours. Apply to lower extremities every 8 hours           BP 152/50  Temp(Src) 98.6 F (37 C) (Oral)  Resp 20  SpO2 100%  Physical Exam  Nursing note and vitals reviewed. Constitutional: She is oriented to person, place, and time. She appears well-developed and well-nourished. No distress.  HENT:  Head: Normocephalic and atraumatic.  Eyes: EOM are normal.  Neck: Normal range of motion.  Cardiovascular: Normal rate, regular rhythm and normal heart sounds.   Pulmonary/Chest: Effort normal and breath sounds normal.  Abdominal: Soft. She exhibits no distension. There is no tenderness.  Musculoskeletal: Normal range of motion.  Tenderness of left ankle without deformity noted.  Normal pulses in left foot.  Mild sacral coccygeal pain as well as mild tenderness to L-spine.  Neurological: She is alert and oriented to person, place, and time.  Skin: Skin is warm and dry.  Psychiatric: She has a normal mood and affect. Judgment normal.     ED Course  Procedures (including critical care time)  Labs Reviewed - No data to display Dg Lumbar Spine Complete  07/28/2012  *RADIOLOGY REPORT*  Clinical Data: Low back pain after fall.  LUMBAR SPINE - COMPLETE 4+ VIEW  Comparison: 06/19/2012  Findings: Five lumbar type vertebrae.  Slight anterior subluxation of L4 on L5 is stable since previous study and likely due to degenerative change.  Otherwise normal alignment of the lumbar vertebrae and facet joints.  Degenerative changes with narrowed lumbar interspaces and assisted endplate hypertrophic changes.  No vertebral compression deformities.  No focal bone lesion or bone destruction.  Bone cortex and trabecular architecture appear intact.  Vascular calcifications in the aorta.  No significant change since previous study.  IMPRESSION: Degenerative changes in the lumbar spine.  No displaced fractures identified.   Original Report Authenticated By: Burman Nieves, M.D.    Dg Sacrum/coccyx  07/28/2012  *RADIOLOGY REPORT*  Clinical Data: Low back pain radiating to the coccyx after fall.  SACRUM AND COCCYX - 2+ VIEW  Comparison: Lumbar spine 06/19/2012  Findings: The sacral coccygeal spine appears intact.  No abnormal displacement.  No cortical depression.  No focal bone lesion or bone destruction.  Sacral struts appear symmetrical.  Degenerative changes in the lower lumbar spine and hips.  Calcified phleboliths in the pelvis.  IMPRESSION: No displaced sacral coccygeal fractures identified.   Original Report Authenticated By: Burman Nieves, M.D.    Dg Ankle Complete Left  07/28/2012  *RADIOLOGY REPORT*  Clinical Data: Pain and swelling of the lateral malleolus after fall.  LEFT ANKLE COMPLETE - 3+ VIEW  Comparison: 06/19/2012  Findings: Diffuse bone demineralization.  There is fragmentation inferior to the medial malleolus and at the posterior malleolus with slight irregularity and callus formation in the lateral malleolus.  Changes are consistent  with healing of previously identified fractures.  Residual widening of the medial tibiotalar joint consistent with ligamentous injury.  The appearance is similar to previous study.  No evidence of any acute fracture.  No focal bone lesion or bone destruction.  Plantar and Achilles calcaneal spurs.  IMPRESSION: Healing trimalleolar fractures with widening of the medial tibiotalar joint.  No acute fractures are demonstrated.   Original Report Authenticated By: Burman Nieves, M.D.  I personally reviewed the imaging tests through PACS system I reviewed available ER/hospitalization records through the EMR   1. Fall, initial encounter   2. Left ankle pain   3. Sacral pain       MDM  Healing left ankle fracture.  Patient has a fracture boot at her facility.  Have instructed that she put this back on when she returns.  C-spine cleared by Nexus criteria.  No indication for imaging of her head.  Discharge back to nursing home in good condition.  The patient feels better.        Lyanne Co, MD 07/28/12 [phone]

## 2012-07-28 NOTE — ED Notes (Signed)
Brought in by EMS from [facility] after her fall tonight; pt sustained skin tears and also c/o left ankle pain.  Per EMS, pt has had recent injury to left ankle and is wearing a "boot" for it but pt was not wearing boot during this fall; pt was transferring self from bed to wheelchair when she fell; pt fell on her buttocks; pt sustained skin tears to left shin, left upper leg, right thumb--- bleeding to skin tears controlled. Pt arrived to ED room A/Ox3, no s/s acute distress noted.

## 2012-07-28 NOTE — ED Notes (Signed)
[facility] staff was called and notified of pt's condition and discharge--- staff was instructed to place pt's leg boot at all times; was made aware of pt's left ankle fracture that is healing, no new fractures with this recent fall.

## 2012-09-14 ENCOUNTER — Emergency Department (HOSPITAL_COMMUNITY): Payer: Medicare Other

## 2012-09-14 ENCOUNTER — Emergency Department (HOSPITAL_COMMUNITY)
Admission: EM | Admit: 2012-09-14 | Discharge: 2012-09-14 | Disposition: A | Discharge status: Home / Self Care | Payer: Medicare Other | Attending: Emergency Medicine | Admitting: Emergency Medicine

## 2012-09-14 DIAGNOSIS — S99929A Unspecified injury of unspecified foot, initial encounter: Secondary | ICD-10-CM | POA: Insufficient documentation

## 2012-09-14 DIAGNOSIS — S46909A Unspecified injury of unspecified muscle, fascia and tendon at shoulder and upper arm level, unspecified arm, initial encounter: Secondary | ICD-10-CM | POA: Insufficient documentation

## 2012-09-14 DIAGNOSIS — M25572 Pain in left ankle and joints of left foot: Secondary | ICD-10-CM

## 2012-09-14 DIAGNOSIS — I10 Essential (primary) hypertension: Secondary | ICD-10-CM | POA: Insufficient documentation

## 2012-09-14 DIAGNOSIS — G309 Alzheimer's disease, unspecified: Secondary | ICD-10-CM | POA: Insufficient documentation

## 2012-09-14 DIAGNOSIS — S0990XA Unspecified injury of head, initial encounter: Secondary | ICD-10-CM | POA: Insufficient documentation

## 2012-09-14 DIAGNOSIS — Y9389 Activity, other specified: Secondary | ICD-10-CM | POA: Insufficient documentation

## 2012-09-14 DIAGNOSIS — S0993XA Unspecified injury of face, initial encounter: Secondary | ICD-10-CM | POA: Insufficient documentation

## 2012-09-14 DIAGNOSIS — S8990XA Unspecified injury of unspecified lower leg, initial encounter: Secondary | ICD-10-CM | POA: Insufficient documentation

## 2012-09-14 DIAGNOSIS — IMO0002 Reserved for concepts with insufficient information to code with codable children: Secondary | ICD-10-CM | POA: Insufficient documentation

## 2012-09-14 DIAGNOSIS — R296 Repeated falls: Secondary | ICD-10-CM | POA: Insufficient documentation

## 2012-09-14 DIAGNOSIS — E079 Disorder of thyroid, unspecified: Secondary | ICD-10-CM | POA: Insufficient documentation

## 2012-09-14 DIAGNOSIS — F0280 Dementia in other diseases classified elsewhere without behavioral disturbance: Secondary | ICD-10-CM | POA: Insufficient documentation

## 2012-09-14 DIAGNOSIS — S199XXA Unspecified injury of neck, initial encounter: Secondary | ICD-10-CM | POA: Insufficient documentation

## 2012-09-14 DIAGNOSIS — Y92129 Unspecified place in nursing home as the place of occurrence of the external cause: Secondary | ICD-10-CM

## 2012-09-14 DIAGNOSIS — Y9210 Unspecified residential institution as the place of occurrence of the external cause: Secondary | ICD-10-CM | POA: Insufficient documentation

## 2012-09-14 DIAGNOSIS — T148XXA Other injury of unspecified body region, initial encounter: Secondary | ICD-10-CM

## 2012-09-14 DIAGNOSIS — M533 Sacrococcygeal disorders, not elsewhere classified: Secondary | ICD-10-CM

## 2012-09-14 DIAGNOSIS — Z8639 Personal history of other endocrine, nutritional and metabolic disease: Secondary | ICD-10-CM | POA: Insufficient documentation

## 2012-09-14 DIAGNOSIS — S4980XA Other specified injuries of shoulder and upper arm, unspecified arm, initial encounter: Secondary | ICD-10-CM | POA: Insufficient documentation

## 2012-09-14 DIAGNOSIS — Z862 Personal history of diseases of the blood and blood-forming organs and certain disorders involving the immune mechanism: Secondary | ICD-10-CM | POA: Insufficient documentation

## 2012-09-14 DIAGNOSIS — Z79899 Other long term (current) drug therapy: Secondary | ICD-10-CM | POA: Insufficient documentation

## 2012-09-14 DIAGNOSIS — Z8669 Personal history of other diseases of the nervous system and sense organs: Secondary | ICD-10-CM | POA: Insufficient documentation

## 2012-09-14 DIAGNOSIS — M542 Cervicalgia: Secondary | ICD-10-CM

## 2012-09-14 DIAGNOSIS — M79602 Pain in left arm: Secondary | ICD-10-CM

## 2012-09-14 DIAGNOSIS — W19XXXA Unspecified fall, initial encounter: Secondary | ICD-10-CM

## 2012-09-14 MED ORDER — HYDROCODONE-ACETAMINOPHEN 5-325 MG PO TABS: 1.0000 | ORAL_TABLET | Freq: Three times a day (TID) | ORAL | Status: DC | PRN

## 2012-09-14 MED ORDER — HYDROCODONE-ACETAMINOPHEN 5-325 MG PO TABS
1.0000 | ORAL_TABLET | Freq: Once | ORAL | Status: AC
  Administered 2012-09-14: 1 via ORAL
  Filled 2012-09-14: qty 1

## 2012-09-14 NOTE — ED Notes (Signed)
Patient transported to CT 

## 2012-09-14 NOTE — ED Notes (Signed)
Pt has limited ranged of motion in neck due to pain in the neck and shoulders. Pt has towel wrapped around her neck because she could not tolerate placement of a collar.

## 2012-09-14 NOTE — ED Notes (Signed)
Pt up with 2 assist to ambulate in room and in hall. Pt has pain to left ankle but still able to ambulate.

## 2012-09-14 NOTE — ED Provider Notes (Signed)
History     CSN: [id number]  Arrival date & time 09/14/12  6213   First MD Initiated Contact with Patient 09/14/12 0423      Chief Complaint  Patient presents with  . Fall    (Consider location/radiation/quality/duration/timing/severity/associated sxs/prior treatment) HPI 79 year old female presents to the emergency department via EMS from her nursing facility after a fall.  Patient reports that she was using the toilet, and when she got up and pivoted to use the sink, she fell onto her left side.  Patient is complaining of pain to her head, left side of her neck, left arm, coccyx, and left ankle.  She denies any LOC.  Patient is able to move her left arm and leg and also turn her neck from side to side, but reports she has pain with these movements.  No other complaints or injuries at this time Past Medical History  Diagnosis Date  . Dementia   . Alzheimer's dementia   . Thyroid disease   . Hypertension   . Hypokalemia   . DJD (degenerative joint disease)   . Peripheral neuropathy     No past surgical history on file.  No family history on file.  History  Substance Use Topics  . Smoking status: Never Smoker   . Smokeless tobacco: Never Used  . Alcohol Use: No    OB History   Grav Para Term Preterm Abortions TAB SAB Ect Mult Living                  Review of Systems  All other systems reviewed and are negative.    Allergies  Review of patient's allergies indicates no known allergies.  Home Medications   Current Outpatient Rx  Name  Route  Sig  Dispense  Refill  . atenolol (TENORMIN) 50 MG tablet   Oral   Take 50 mg by mouth every morning.          . divalproex (DEPAKOTE) 125 MG DR tablet   Oral   Take 125-250 mg by mouth daily. Take 1 tablet every morning and 2 tablets every evening         . escitalopram (LEXAPRO) 10 MG tablet   Oral   Take 10 mg by mouth every morning.          . gabapentin (NEURONTIN) 600 MG tablet   Oral   Take 600 mg  by mouth every evening.          Marland Kitchen HYDROcodone-acetaminophen (NORCO/VICODIN) 5-325 MG per tablet   Oral   Take 1 tablet by mouth every 6 (six) hours as needed for pain.   6 tablet   0   . levothyroxine (SYNTHROID, LEVOTHROID) 50 MCG tablet   Oral   Take 50 mcg by mouth every morning.          Marland Kitchen lisinopril (PRINIVIL,ZESTRIL) 10 MG tablet   Oral   Take 10 mg by mouth daily.         Marland Kitchen omeprazole (PRILOSEC) 20 MG capsule   Oral   Take 40 mg by mouth daily.          . rivastigmine (EXELON) 4.6 mg/24hr   Transdermal   Place 1 patch onto the skin every morning.          . traZODone (DESYREL) 50 MG tablet   Oral   Take 25 mg by mouth at bedtime.         . triamcinolone cream (KENALOG) 0.1 %   Topical  Apply 1 application topically every 8 (eight) hours. Apply to lower extremities every 8 hours           BP 166/58  Pulse 60  Temp(Src) 97.6 F (36.4 C) (Oral)  Resp 24  SpO2 98%  Physical Exam  Nursing note and vitals reviewed. Constitutional: She is oriented to person, place, and time. She appears well-developed and well-nourished. No distress.  HENT:  Head: Normocephalic and atraumatic.  Right Ear: External ear normal.  Left Ear: External ear normal.  Nose: Nose normal.  Mouth/Throat: Oropharynx is clear and moist.  Eyes: Conjunctivae and EOM are normal. Pupils are equal, round, and reactive to light.  Neck: Normal range of motion. Neck supple. No JVD present. No tracheal deviation present. No thyromegaly present.  Patient. with tenderness to left side of neck with palpation.  No tenderness over her cervical spine.  Patient has full range of motion of the neck  Cardiovascular: Normal rate, regular rhythm, normal heart sounds and intact distal pulses.  Exam reveals no gallop and no friction rub.   No murmur heard. Pulmonary/Chest: Effort normal and breath sounds normal. No stridor. No respiratory distress. She has no wheezes. She has no rales. She exhibits  no tenderness.  Abdominal: Soft. Bowel sounds are normal. She exhibits no distension and no mass. There is no tenderness. There is no rebound and no guarding.  Musculoskeletal: Normal range of motion. She exhibits tenderness (patient with tenderness to palpation of left ankle.  There is no deformity noted.  She has mild tenderness to her left arm.  She has full range of motion.  There is no crepitus, step-off or deformity noted to the left arm.   ). She exhibits no edema.  Patient also with tenderness to palpation to lower lumbar and coccyx.  There is no step-off, crepitus or bony tenderness.  There is no bruising noted  Lymphadenopathy:    She has no cervical adenopathy.  Neurological: She is alert and oriented to person, place, and time. She exhibits normal muscle tone. Coordination normal.  Skin: Skin is warm and dry. No rash noted. No erythema. No pallor.  Psychiatric: She has a normal mood and affect. Her behavior is normal. Judgment and thought content normal.    ED Course  Procedures (including critical care time)  Labs Reviewed - No data to display Dg Sacrum/coccyx  09/14/2012   *RADIOLOGY REPORT*  Clinical Data: Larey Seat.  Sacral pain.  SACRUM AND COCCYX - 2+ VIEW  Comparison: None  Findings: The pubic symphysis and SI joints are intact.  Both hips are normally located.  No definite acute sacral fracture.  IMPRESSION: No acute bony findings.   Original Report Authenticated By: Rudie Meyer, M.D.   Dg Ankle Complete Left  09/14/2012   *RADIOLOGY REPORT*  Clinical Data: Larey Seat.  Left ankle pain.  LEFT ANKLE COMPLETE - 3+ VIEW  Comparison: 07/28/2012.  Findings: Remote healed fractures are noted.  There are tibiotalar degenerative changes but no new/acute fracture.  The mid and hind foot bony structures are intact.  Calcaneal spurring changes are noted.  IMPRESSION: Evidence of remote trauma but no definite acute fracture.   Original Report Authenticated By: Rudie Meyer, M.D.   Ct Head Wo  Contrast  09/14/2012   *RADIOLOGY REPORT*  Clinical Data:  Hit head.  CT HEAD WITHOUT CONTRAST CT CERVICAL SPINE WITHOUT CONTRAST  Technique:  Multidetector CT imaging of the head and cervical spine was performed following the standard protocol without intravenous contrast.  Multiplanar CT  image reconstructions of the cervical spine were also generated.  Comparison:  Head CT 05/17/2012.  CT HEAD  Findings: The ventricles are normal.  No extra-axial fluid collections are seen.  The brainstem and cerebellum are unremarkable.  No acute intracranial findings such as infarction or hemorrhage.  No mass lesions.  The bony calvarium is intact.  The visualized paranasal sinuses and mastoid air cells are clear.  IMPRESSION: No acute intracranial findings or mass lesion. No acute skull fracture.  CT CERVICAL SPINE  Findings: The sagittal reformatted images demonstrate normal alignment of the cervical vertebral bodies.  Disc spaces and vertebral bodies are maintained.  No acute bony findings or abnormal prevertebral soft tissue swelling. Moderate bilateral facet disease, right greater than left.  The facets are normally aligned.  No facet or laminar fractures are seen. No large disc protrusions.  The neural foramen are patent.  The skull base C1 and C1-C2 articulations are maintained.  The dens is normal.  There are scattered cervical lymph nodes.  The lung apices are clear.  IMPRESSION: Degenerative cervical spondylosis with disc disease and facet disease.  No acute bony findings.   Original Report Authenticated By: Rudie Meyer, M.D.   Ct Cervical Spine Wo Contrast  09/14/2012   *RADIOLOGY REPORT*  Clinical Data:  Hit head.  CT HEAD WITHOUT CONTRAST CT CERVICAL SPINE WITHOUT CONTRAST  Technique:  Multidetector CT imaging of the head and cervical spine was performed following the standard protocol without intravenous contrast.  Multiplanar CT image reconstructions of the cervical spine were also generated.  Comparison:   Head CT 05/17/2012.  CT HEAD  Findings: The ventricles are normal.  No extra-axial fluid collections are seen.  The brainstem and cerebellum are unremarkable.  No acute intracranial findings such as infarction or hemorrhage.  No mass lesions.  The bony calvarium is intact.  The visualized paranasal sinuses and mastoid air cells are clear.  IMPRESSION: No acute intracranial findings or mass lesion. No acute skull fracture.  CT CERVICAL SPINE  Findings: The sagittal reformatted images demonstrate normal alignment of the cervical vertebral bodies.  Disc spaces and vertebral bodies are maintained.  No acute bony findings or abnormal prevertebral soft tissue swelling. Moderate bilateral facet disease, right greater than left.  The facets are normally aligned.  No facet or laminar fractures are seen. No large disc protrusions.  The neural foramen are patent.  The skull base C1 and C1-C2 articulations are maintained.  The dens is normal.  There are scattered cervical lymph nodes.  The lung apices are clear.  IMPRESSION: Degenerative cervical spondylosis with disc disease and facet disease.  No acute bony findings.   Original Report Authenticated By: Rudie Meyer, M.D.     1. Fall at nursing home, initial encounter   2. Muscle strain   3. Neck pain on left side   4. Left arm pain   5. Left ankle pain   6. Coccygeal pain, acute       MDM  79 year old female status post fall.  Her x-rays are negative.  Suspect muscle strain.  We'll continue her Vicodin as previously prescribed.  To followup with her primary care Dr.        Olivia Mackie, MD 09/14/12 [phone]

## 2012-09-14 NOTE — ED Notes (Signed)
[id number]<AV> Expected date:<BR> Expected time:<BR> Means of arrival:<BR> Comments:<BR> EMS/79 yo female from SNF-fall

## 2012-09-14 NOTE — ED Notes (Addendum)
Pt was using the bathroom and slipped and hit her head on the wall. Pt complaining of headache, neck pain and coccyx pain. No open wounds found per EMS.  No LOC. Pt denies any dizziness.

## 2012-10-17 ENCOUNTER — Other Ambulatory Visit: Payer: Self-pay | Admitting: Surgery

## 2012-11-14 ENCOUNTER — Other Ambulatory Visit: Payer: Self-pay | Admitting: Surgery

## 2013-04-25 ENCOUNTER — Encounter (HOSPITAL_COMMUNITY): Payer: Self-pay | Admitting: Emergency Medicine

## 2013-04-25 ENCOUNTER — Emergency Department (HOSPITAL_COMMUNITY): Payer: Medicare Other

## 2013-04-25 ENCOUNTER — Emergency Department (HOSPITAL_COMMUNITY)
Admission: EM | Admit: 2013-04-25 | Discharge: 2013-04-26 | Disposition: A | Discharge status: Skilled Nursing Facility | Payer: Medicare Other | Attending: Emergency Medicine | Admitting: Emergency Medicine

## 2013-04-25 DIAGNOSIS — G609 Hereditary and idiopathic neuropathy, unspecified: Secondary | ICD-10-CM | POA: Insufficient documentation

## 2013-04-25 DIAGNOSIS — W19XXXA Unspecified fall, initial encounter: Secondary | ICD-10-CM

## 2013-04-25 DIAGNOSIS — R509 Fever, unspecified: Secondary | ICD-10-CM

## 2013-04-25 DIAGNOSIS — Y9210 Unspecified residential institution as the place of occurrence of the external cause: Secondary | ICD-10-CM | POA: Insufficient documentation

## 2013-04-25 DIAGNOSIS — E079 Disorder of thyroid, unspecified: Secondary | ICD-10-CM | POA: Insufficient documentation

## 2013-04-25 DIAGNOSIS — Z79899 Other long term (current) drug therapy: Secondary | ICD-10-CM | POA: Insufficient documentation

## 2013-04-25 DIAGNOSIS — W1809XA Striking against other object with subsequent fall, initial encounter: Secondary | ICD-10-CM | POA: Insufficient documentation

## 2013-04-25 DIAGNOSIS — IMO0002 Reserved for concepts with insufficient information to code with codable children: Secondary | ICD-10-CM

## 2013-04-25 DIAGNOSIS — G309 Alzheimer's disease, unspecified: Secondary | ICD-10-CM | POA: Insufficient documentation

## 2013-04-25 DIAGNOSIS — Y9301 Activity, walking, marching and hiking: Secondary | ICD-10-CM | POA: Insufficient documentation

## 2013-04-25 DIAGNOSIS — I10 Essential (primary) hypertension: Secondary | ICD-10-CM | POA: Insufficient documentation

## 2013-04-25 DIAGNOSIS — F0280 Dementia in other diseases classified elsewhere without behavioral disturbance: Secondary | ICD-10-CM | POA: Insufficient documentation

## 2013-04-25 DIAGNOSIS — S41009A Unspecified open wound of unspecified shoulder, initial encounter: Secondary | ICD-10-CM | POA: Insufficient documentation

## 2013-04-25 DIAGNOSIS — M1990 Unspecified osteoarthritis, unspecified site: Secondary | ICD-10-CM | POA: Insufficient documentation

## 2013-04-25 MED ORDER — ACETAMINOPHEN 325 MG PO TABS
650.0000 mg | ORAL_TABLET | Freq: Once | ORAL | Status: AC
  Administered 2013-04-25: 650 mg via ORAL
  Filled 2013-04-25: qty 2
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
## 2013-04-25 NOTE — ED Provider Notes (Signed)
CSN: [id number]     Arrival date & time 04/25/13  2116 History   First MD Initiated Contact with Patient 04/25/13 2316     Chief Complaint  Patient presents with  . Fall   (Consider location/radiation/quality/duration/timing/severity/associated sxs/prior Treatment) Patient is a 79 y.o. female presenting with fall. The history is provided by the nursing home and the patient. The history is limited by the condition of the patient (Dementia).  Fall  She is a resident at a nursing home where she was observed to fall walking to the bathroom. Patient states that she fell because she was wrestling with a man and that this is the second time and has done that to her. She was observed to fall on her right shoulder. Patient is not complaining of anything currently. There is no known loss of consciousness.  Past Medical History  Diagnosis Date  . Dementia   . Alzheimer's dementia   . Thyroid disease   . Hypertension   . Hypokalemia   . DJD (degenerative joint disease)   . Peripheral neuropathy    History reviewed. No pertinent past surgical history. No family history on file. History  Substance Use Topics  . Smoking status: Never Smoker   . Smokeless tobacco: Never Used  . Alcohol Use: No   OB History   Grav Para Term Preterm Abortions TAB SAB Ect Mult Living                 Review of Systems  Unable to perform ROS: Dementia    Allergies  Review of patient's allergies indicates no known allergies.  Home Medications   Current Outpatient Rx  Name  Route  Sig  Dispense  Refill  . acetaminophen (TYLENOL) 500 MG tablet   Oral   Take 500 mg by mouth every 4 (four) hours as needed for mild pain or fever.         Marland Kitchen alum & mag hydroxide-simeth (MAALOX/MYLANTA) 200-200-20 MG/5ML suspension   Oral   Take 30 mLs by mouth 4 (four) times daily as needed for indigestion or heartburn.         Marland Kitchen atenolol (TENORMIN) 50 MG tablet   Oral   Take 50 mg by mouth every morning.           . cetirizine (ZYRTEC) 10 MG tablet   Oral   Take 10 mg by mouth every morning.         . divalproex (DEPAKOTE) 125 MG DR tablet   Oral   Take 125-250 mg by mouth daily. Take 1 tablet every morning and 2 tablets every evening         . escitalopram (LEXAPRO) 20 MG tablet   Oral   Take 20 mg by mouth daily.         Marland Kitchen gabapentin (NEURONTIN) 600 MG tablet   Oral   Take 600 mg by mouth every evening.          Marland Kitchen guaifenesin (ROBITUSSIN) 100 MG/5ML syrup   Oral   Take 200 mg by mouth every 6 (six) hours as needed for cough.         . levothyroxine (SYNTHROID, LEVOTHROID) 50 MCG tablet   Oral   Take 50 mcg by mouth every morning.          Marland Kitchen lisinopril (PRINIVIL,ZESTRIL) 10 MG tablet   Oral   Take 10 mg by mouth daily.         Marland Kitchen loperamide (IMODIUM) 2 MG capsule  Oral   Take 2 mg by mouth as needed for diarrhea or loose stools.         . magnesium hydroxide (MILK OF MAGNESIA) 400 MG/5ML suspension   Oral   Take 30 mLs by mouth daily as needed for mild constipation.         Marland Kitchen omeprazole (PRILOSEC) 20 MG capsule   Oral   Take 40 mg by mouth daily.          . Rivastigmine (EXELON) 13.3 MG/24HR PT24   Transdermal   Place 1 patch onto the skin daily.         . traZODone (DESYREL) 50 MG tablet   Oral   Take 25 mg by mouth at bedtime.         . Triamcinolone Acetonide (TRIAMCINOLONE 0.1 % CREAM : EUCERIN) CREA   Topical   Apply 1 application topically 2 (two) times daily. For 6 weeks start 12.13.14         . Vitamin D, Ergocalciferol, (DRISDOL) 50000 UNITS CAPS capsule   Oral   Take 50,000 Units by mouth every 7 (seven) days. wednesdays          BP 129/55  Pulse 89  Temp(Src) 100.8 F (38.2 C) (Rectal)  Resp 20  SpO2 97% Physical Exam  Nursing note and vitals reviewed.  79 year old female, resting comfortably and in no acute distress. Vital signs are significant for low-grade fever with temperature 100.8. Oxygen saturation is 97%, which  is normal. Head is normocephalic and atraumatic. PERRLA, EOMI. Oropharynx is clear. Neck is nontender and supple without adenopathy or JVD. Back is nontender and there is no CVA tenderness. Lungs are clear without rales, wheezes, or rhonchi. Chest is nontender. Skin tear is present over the left clavicle. Heart has regular rate and rhythm without murmur. Abdomen is soft, flat, nontender without masses or hepatosplenomegaly and peristalsis is normoactive. Extremities have no cyanosis or edema, full range of motion is present. Skin is warm and dry without rash. Neurologic: She is awake and alert and oriented to person, she knows she is in a hospital but not which hospital and this not know why, she is disoriented to time, cranial nerves are intact, there are no motor or sensory deficits.  ED Course  Procedures (including critical care time) Labs Review Results for orders placed during the hospital encounter of 04/25/13  CBC WITH DIFFERENTIAL      Result Value Range   WBC 6.3  4.0 - 10.5 K/uL   RBC 3.91  3.87 - 5.11 MIL/uL   Hemoglobin 12.6  12.0 - 15.0 g/dL   HCT 40.9  81.1 - 91.4 %   MCV 92.3  78.0 - 100.0 fL   MCH 32.2  26.0 - 34.0 pg   MCHC 34.9  30.0 - 36.0 g/dL   RDW 78.2  95.6 - 21.3 %   Platelets 170  150 - 400 K/uL   Neutrophils Relative % 53  43 - 77 %   Neutro Abs 3.3  1.7 - 7.7 K/uL   Lymphocytes Relative 28  12 - 46 %   Lymphs Abs 1.8  0.7 - 4.0 K/uL   Monocytes Relative 19 (*) 3 - 12 %   Monocytes Absolute 1.2 (*) 0.1 - 1.0 K/uL   Eosinophils Relative 1  0 - 5 %   Eosinophils Absolute 0.0  0.0 - 0.7 K/uL   Basophils Relative 0  0 - 1 %   Basophils Absolute 0.0  0.0 -  0.1 K/uL  COMPREHENSIVE METABOLIC PANEL      Result Value Range   Sodium 131 (*) 137 - 147 mEq/L   Potassium 4.5  3.7 - 5.3 mEq/L   Chloride 94 (*) 96 - 112 mEq/L   CO2 24  19 - 32 mEq/L   Glucose, Bld 94  70 - 99 mg/dL   BUN 16  6 - 23 mg/dL   Creatinine, Ser 2.13  0.50 - 1.10 mg/dL   Calcium 9.4   8.4 - 08.6 mg/dL   Total Protein 7.3  6.0 - 8.3 g/dL   Albumin 3.4 (*) 3.5 - 5.2 g/dL   AST 31  0 - 37 U/L   ALT 12  0 - 35 U/L   Alkaline Phosphatase 52  39 - 117 U/L   Total Bilirubin 0.3  0.3 - 1.2 mg/dL   GFR calc non Af Amer 56 (*) >90 mL/min   GFR calc Af Amer 65 (*) >90 mL/min   Imaging Review Dg Chest 2 View  04/26/2013   CLINICAL DATA:  Bilateral shoulder pain after a fall. Left shoulder laceration. Altered mental status.  EXAM: CHEST  2 VIEW  COMPARISON:  04/25/2005  FINDINGS: Mild cardiac enlargement without vascular congestion. Probable emphysematous changes and scattered fibrosis in the lungs. No focal airspace disease or consolidation. No blunting of costophrenic angles. No pneumothorax. Degenerative changes demonstrated in the spine and shoulders. No significant change since previous study.  IMPRESSION: Cardiac enlargement.  No evidence of active pulmonary disease.   Electronically Signed   By: Burman Nieves M.D.   On: 04/26/2013 00:49   Dg Shoulder Right  04/26/2013   CLINICAL DATA:  Bilateral shoulder pain after a fall.  EXAM: RIGHT SHOULDER - 2+ VIEW  COMPARISON:  None.  FINDINGS: Calcification adjacent to the greater tuberosity suggesting calcific tendinitis. Calcification appears well corticated and does not likely represent avulsion fracture. Degenerative changes in the glenohumeral and acromioclavicular joints. Small subacromial spurring. No displaced fracture or dislocation demonstrated in the right shoulder.  IMPRESSION: Degenerative changes in the right shoulder with subacromial spurring and probable calcific tendinosis. No displaced fracture or dislocation.   Electronically Signed   By: Burman Nieves M.D.   On: 04/26/2013 00:50   Ct Head Wo Contrast  04/26/2013   CLINICAL DATA:  Patient fell from standing on the right shoulder. No loss of consciousness or dizziness.  EXAM: CT HEAD WITHOUT CONTRAST  CT CERVICAL SPINE WITHOUT CONTRAST  TECHNIQUE: Multidetector CT  imaging of the head and cervical spine was performed following the standard protocol without intravenous contrast. Multiplanar CT image reconstructions of the cervical spine were also generated.  COMPARISON:  09/14/2012  FINDINGS: CT HEAD FINDINGS  Diffuse cerebral atrophy. Ventricular dilatation consistent with central atrophy. Patchy low-attenuation changes in the white matter consistent with small vessel ischemia. No mass effect or midline shift. No abnormal extra-axial fluid collections. Gray-white matter junctions are distinct. Basal cisterns are not effaced. No evidence of acute intracranial hemorrhage. No depressed skull fractures. Visualized paranasal sinuses and mastoid air cells are not opacified. Vascular calcifications. No significant changes since the prior study.  CT CERVICAL SPINE FINDINGS  Technically limited study due to motion artifact. Normal alignment of the cervical spine. Degenerative changes in the cervical spine with narrowed cervical interspaces and associated endplate hypertrophic changes. Degenerative changes in the facet joints and at C1 to. No vertebral compression deformities. No prevertebral soft tissue swelling. C1-2 articulation appears intact. No focal bone lesion or bone destruction. Bone  cortex and trabecular architecture appear intact. Posterior soft tissue calcifications. Vascular calcifications in the cervical carotid vessels. Scarring in the lung apices. Small nodule demonstrated in the left apex at 2.6 mm, likely benign.  IMPRESSION: CT Head: No acute intracranial abnormalities. Stable appearance of chronic atrophy and small vessel ischemic change.  CT cervical spine: Degenerative changes throughout the cervical spine. No displaced fractures identified. Stable appearance since prior study.   Electronically Signed   By: Burman Nieves M.D.   On: 04/26/2013 00:11   Ct Cervical Spine Wo Contrast  04/26/2013   CLINICAL DATA:  Patient fell from standing on the right  shoulder. No loss of consciousness or dizziness.  EXAM: CT HEAD WITHOUT CONTRAST  CT CERVICAL SPINE WITHOUT CONTRAST  TECHNIQUE: Multidetector CT imaging of the head and cervical spine was performed following the standard protocol without intravenous contrast. Multiplanar CT image reconstructions of the cervical spine were also generated.  COMPARISON:  09/14/2012  FINDINGS: CT HEAD FINDINGS  Diffuse cerebral atrophy. Ventricular dilatation consistent with central atrophy. Patchy low-attenuation changes in the white matter consistent with small vessel ischemia. No mass effect or midline shift. No abnormal extra-axial fluid collections. Gray-white matter junctions are distinct. Basal cisterns are not effaced. No evidence of acute intracranial hemorrhage. No depressed skull fractures. Visualized paranasal sinuses and mastoid air cells are not opacified. Vascular calcifications. No significant changes since the prior study.  CT CERVICAL SPINE FINDINGS  Technically limited study due to motion artifact. Normal alignment of the cervical spine. Degenerative changes in the cervical spine with narrowed cervical interspaces and associated endplate hypertrophic changes. Degenerative changes in the facet joints and at C1 to. No vertebral compression deformities. No prevertebral soft tissue swelling. C1-2 articulation appears intact. No focal bone lesion or bone destruction. Bone cortex and trabecular architecture appear intact. Posterior soft tissue calcifications. Vascular calcifications in the cervical carotid vessels. Scarring in the lung apices. Small nodule demonstrated in the left apex at 2.6 mm, likely benign.  IMPRESSION: CT Head: No acute intracranial abnormalities. Stable appearance of chronic atrophy and small vessel ischemic change.  CT cervical spine: Degenerative changes throughout the cervical spine. No displaced fractures identified. Stable appearance since prior study.   Electronically Signed   By: Burman Nieves M.D.   On: 04/26/2013 00:11   Dg Shoulder Left  04/26/2013   CLINICAL DATA:  Bilateral shoulder pain and left shoulder laceration after a fall.  EXAM: LEFT SHOULDER - 2+ VIEW  COMPARISON:  None.  FINDINGS: Degenerative changes in the left shoulder involving the glenohumeral and acromioclavicular joints. Old appearing calcification adjacent to the greater tuberosity suggesting calcific tendinosis. No evidence of acute fracture or dislocation.  IMPRESSION: Degenerative changes in the left shoulder with changes of calcific tendinosis. No acute fracture or dislocation.   Electronically Signed   By: Burman Nieves M.D.   On: 04/26/2013 00:52    MDM   1. Fall at nursing home, initial encounter   2. Skin tear   3. Fever    Fall without apparent injury other than a skin tear near the left clavicle. Low-grade fever of uncertain cause. She will be screened for occult infection with urinalysis and chest x-ray. She'll also be sent for CT of head and cervical spine as well as x-rays of her right shoulder.  X-rays are unremarkable as is urinalysis and laboratory testing. Source of fever is not clear but there is no indication for antibiotics. A dressing is applied to her skin. She is discharged back  to her nursing home.  Dione Booze, MD 04/26/13 [phone]

## 2013-04-26 ENCOUNTER — Emergency Department (HOSPITAL_COMMUNITY): Payer: Medicare Other

## 2013-04-26 LAB — URINALYSIS, ROUTINE W REFLEX MICROSCOPIC
Glucose, UA: NEGATIVE mg/dL
Ketones, ur: 15 mg/dL — AB
Nitrite: NEGATIVE
Protein, ur: NEGATIVE mg/dL
Urobilinogen, UA: 0.2 mg/dL (ref 0.0–1.0)
pH: 6 (ref 5.0–8.0)

## 2013-04-26 LAB — CBC WITH DIFFERENTIAL/PLATELET
Basophils Relative: 0 % (ref 0–1)
Eosinophils Absolute: 0 10*3/uL (ref 0.0–0.7)
HCT: 36.1 % (ref 36.0–46.0)
Hemoglobin: 12.6 g/dL (ref 12.0–15.0)
Lymphs Abs: 1.8 10*3/uL (ref 0.7–4.0)
MCH: 32.2 pg (ref 26.0–34.0)
MCHC: 34.9 g/dL (ref 30.0–36.0)
MCV: 92.3 fL (ref 78.0–100.0)
Monocytes Absolute: 1.2 10*3/uL — ABNORMAL HIGH (ref 0.1–1.0)
Monocytes Relative: 19 % — ABNORMAL HIGH (ref 3–12)
Neutro Abs: 3.3 10*3/uL (ref 1.7–7.7)
RBC: 3.91 MIL/uL (ref 3.87–5.11)

## 2013-04-26 LAB — COMPREHENSIVE METABOLIC PANEL
ALT: 12 U/L (ref 0–35)
Albumin: 3.4 g/dL — ABNORMAL LOW (ref 3.5–5.2)
Alkaline Phosphatase: 52 U/L (ref 39–117)
BUN: 16 mg/dL (ref 6–23)
CO2: 24 mEq/L (ref 19–32)
Chloride: 94 mEq/L — ABNORMAL LOW (ref 96–112)
Creatinine, Ser: 0.87 mg/dL (ref 0.50–1.10)
GFR calc Af Amer: 65 mL/min — ABNORMAL LOW (ref >90)
GFR calc non Af Amer: 56 mL/min — ABNORMAL LOW (ref >90)
Glucose, Bld: 94 mg/dL (ref 70–99)
Potassium: 4.5 mEq/L (ref 3.7–5.3)
Total Bilirubin: 0.3 mg/dL (ref 0.3–1.2)
Total Protein: 7.3 g/dL (ref 6.0–8.3)

## 2013-04-26 LAB — URINE MICROSCOPIC-ADD ON

## 2013-04-27 LAB — URINE CULTURE: Colony Count: 40000

## 2014-01-21 IMAGING — CR DG CHEST 2V
1 series · 2 of 2 positions shown · non-contrast
Comparison: none

REASON FOR EXAM: ams
COMMENTS:   May transport without cardiac monitor

[Series 1: w chest ap · 0.14mm/px · 2 of 2 slices shown]
[im 1/2]
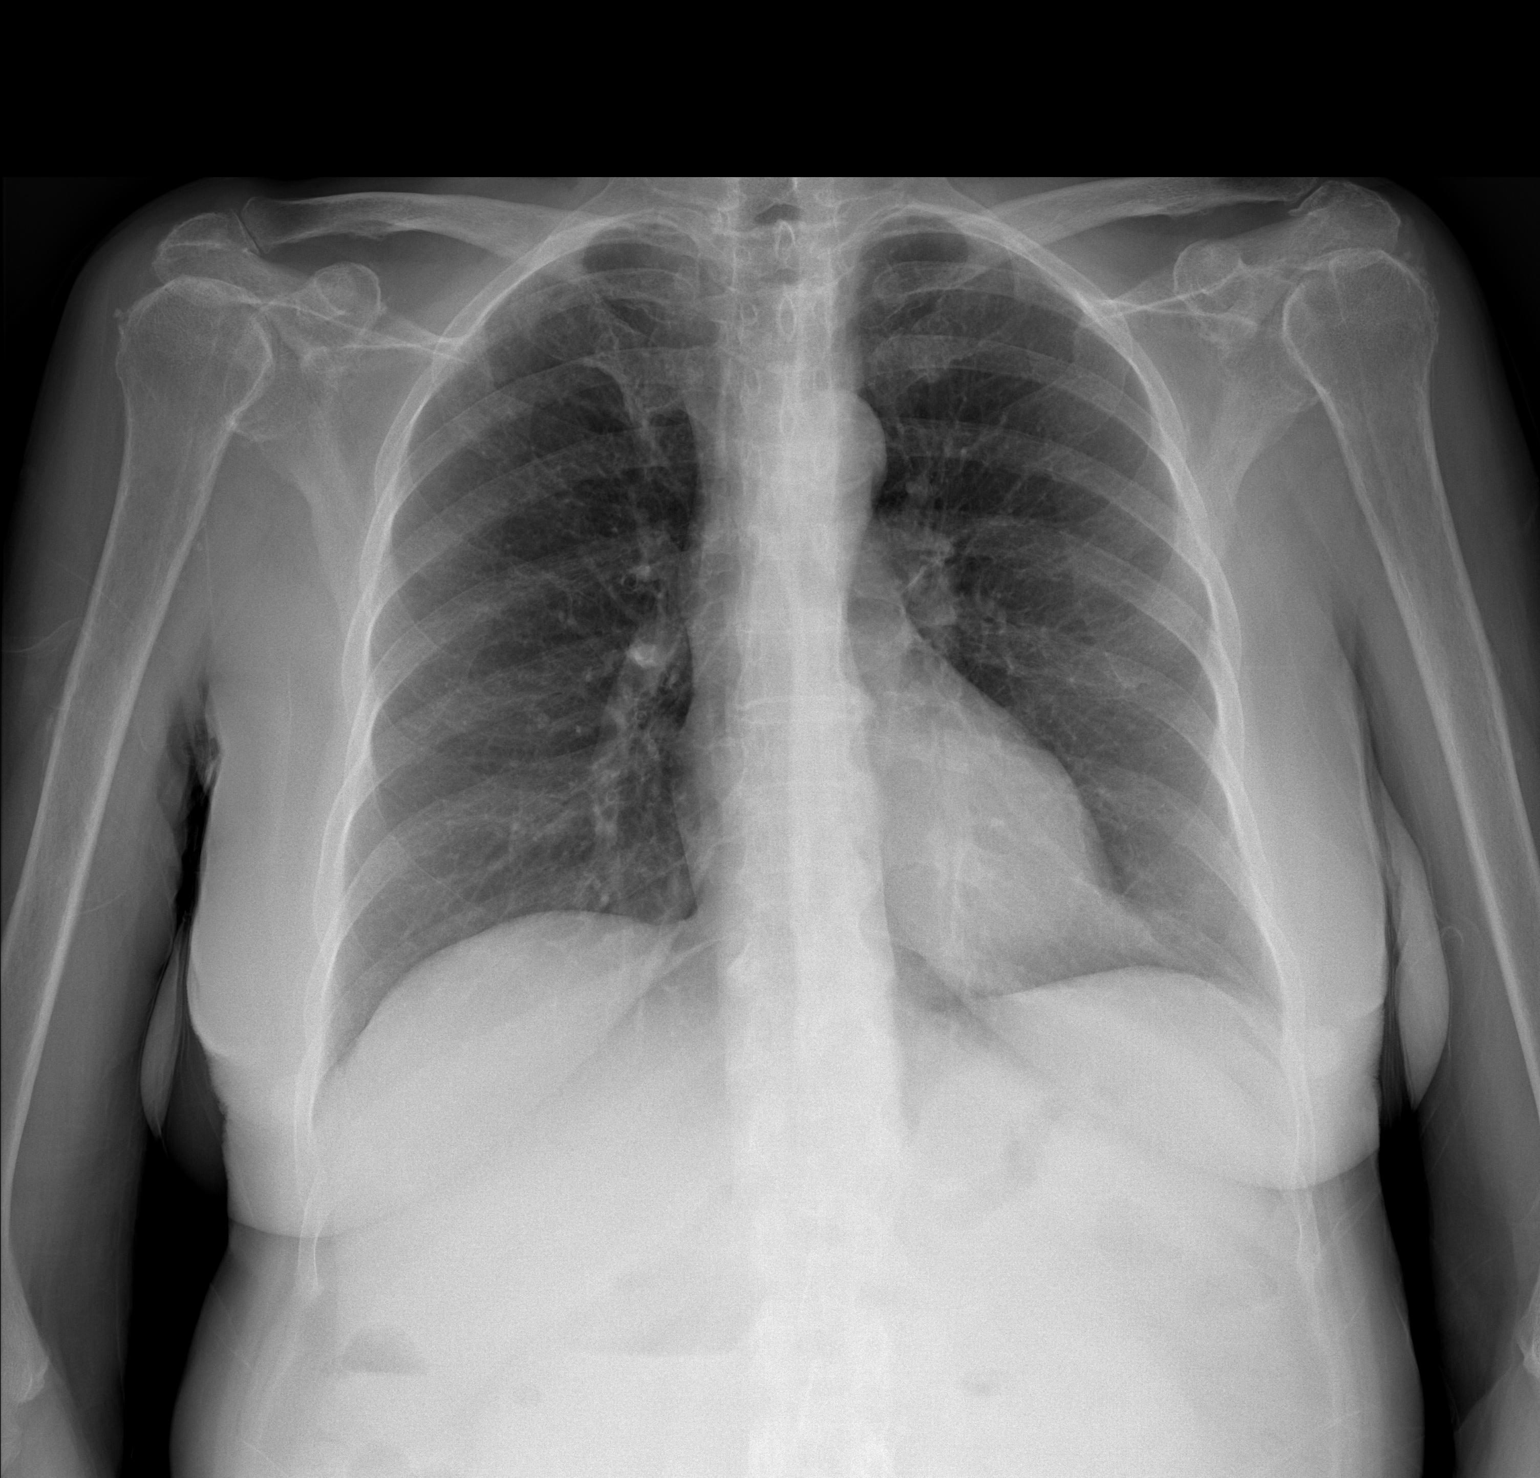
[im 2/2]
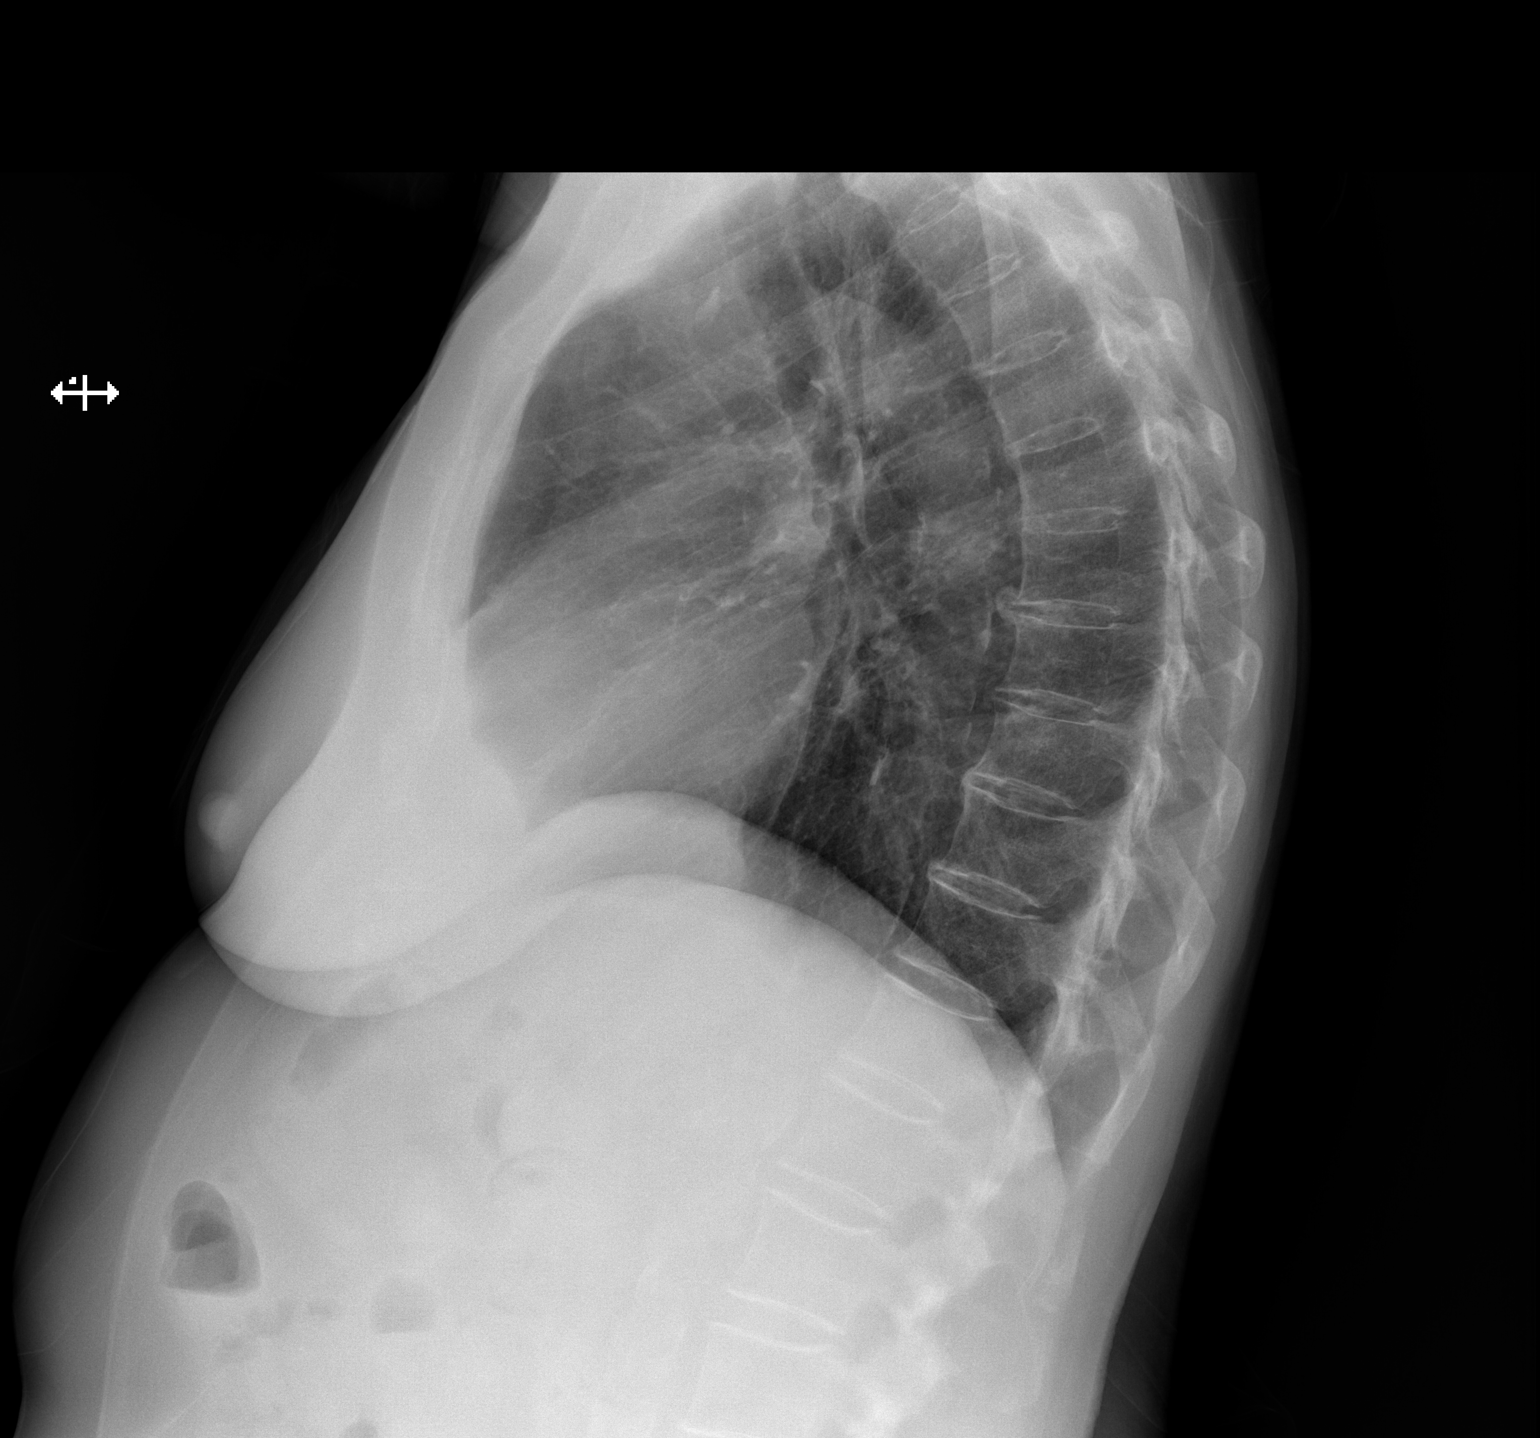

[2 of 2 positions shown; findings below may reference images not displayed]

PROCEDURE:     DXR - DXR CHEST PA (OR AP) AND LATERAL  - September 10, 2011  [DATE]

RESULT:     Comparison is made to a study December 30, 2009.

The lungs are adequately inflated. There is no focal infiltrate. Mediastinum
is normal in width. The cardiac silhouette is normal in size. There is no
pleural effusion. The bony thorax exhibits no acute abnormality.
IMPRESSION: I see no acute cardiopulmonary abnormality.

## 2014-10-18 ENCOUNTER — Other Ambulatory Visit: Payer: Self-pay | Admitting: Specialist

## 2015-01-26 IMAGING — CR DG ANKLE COMPLETE 3+V*L*
3 series · 3 of 3 positions shown · non-contrast
Comparison: 07/28/2012.

CLINICAL DATA: Fell.  Left ankle pain.

LEFT ANKLE COMPLETE - 3+ VIEW

[x ankle ap left]
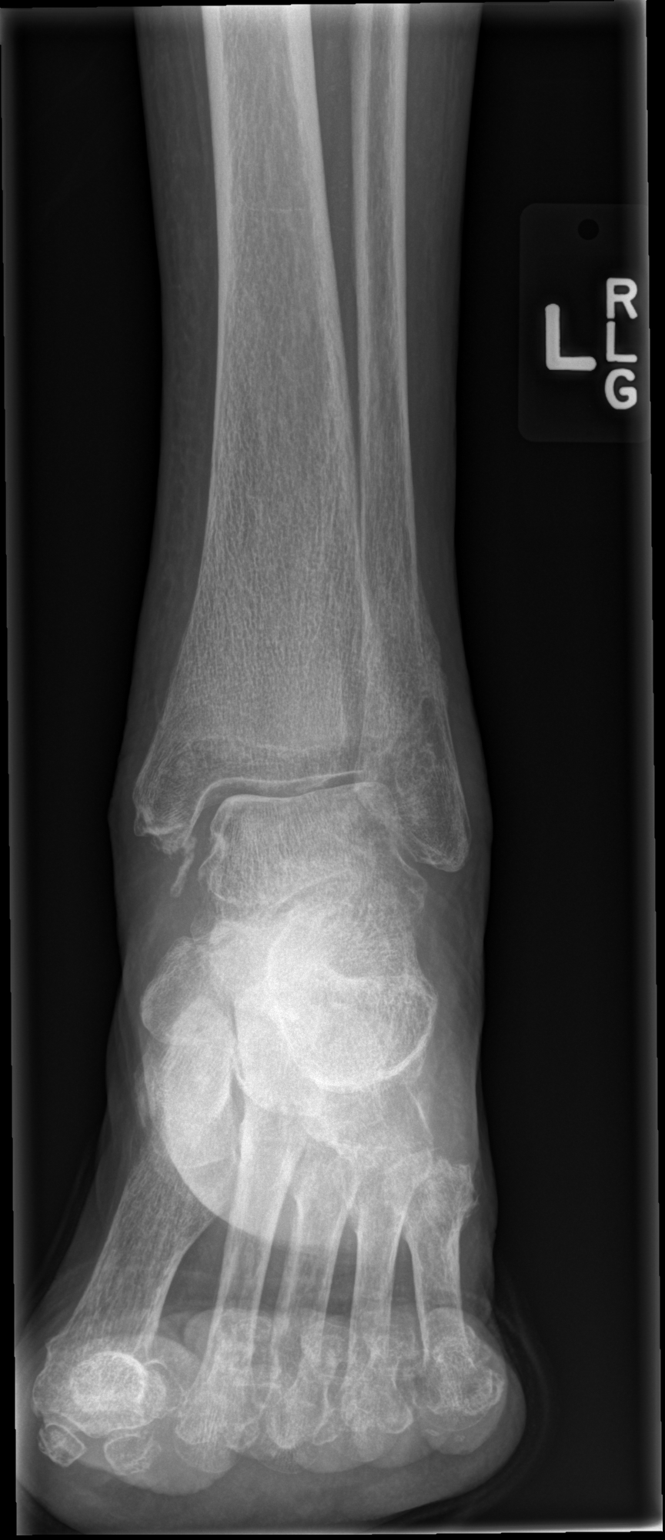

[x ankle obl left]
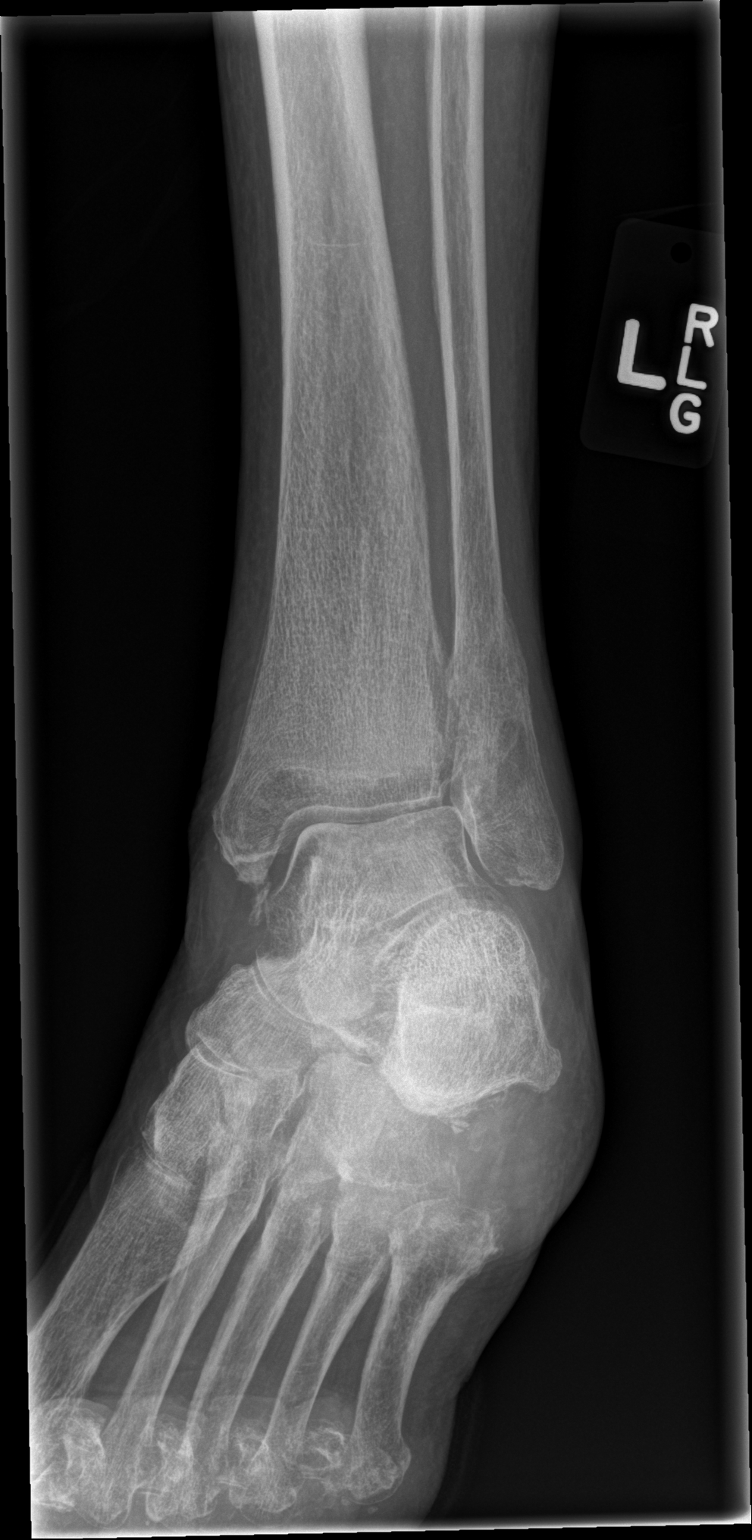

[x ankle lat left]
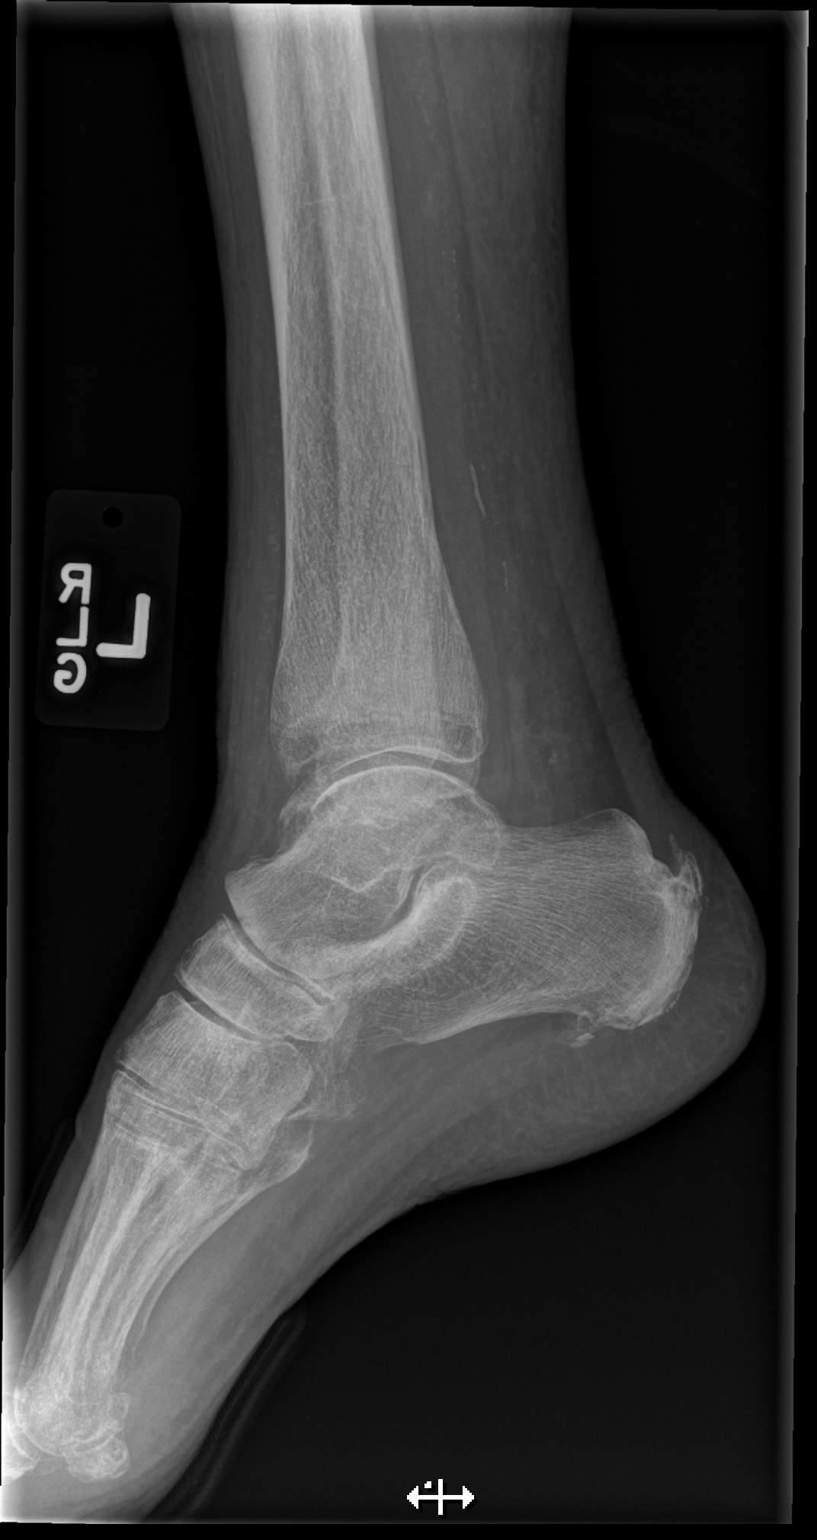

[3 of 3 positions shown; findings below may reference images not displayed]

FINDINGS: Remote healed fractures are noted.  There are tibiotalar
degenerative changes but no new/acute fracture.  The mid and hind
foot bony structures are intact.  Calcaneal spurring changes are
noted.
IMPRESSION: Evidence of remote trauma but no definite acute fracture.

## 2015-11-08 ENCOUNTER — Emergency Department (HOSPITAL_COMMUNITY)
Admission: EM | Admit: 2015-11-08 | Discharge: 2015-11-08 | Disposition: A | Discharge status: Home / Self Care | Payer: Medicare Other | Attending: Emergency Medicine | Admitting: Emergency Medicine

## 2015-11-08 ENCOUNTER — Encounter (HOSPITAL_COMMUNITY): Payer: Self-pay | Admitting: Emergency Medicine

## 2015-11-08 ENCOUNTER — Emergency Department (HOSPITAL_COMMUNITY): Payer: Medicare Other

## 2015-11-08 DIAGNOSIS — W19XXXA Unspecified fall, initial encounter: Secondary | ICD-10-CM | POA: Insufficient documentation

## 2015-11-08 DIAGNOSIS — M549 Dorsalgia, unspecified: Secondary | ICD-10-CM | POA: Diagnosis not present

## 2015-11-08 DIAGNOSIS — I10 Essential (primary) hypertension: Secondary | ICD-10-CM | POA: Insufficient documentation

## 2015-11-08 DIAGNOSIS — G309 Alzheimer's disease, unspecified: Secondary | ICD-10-CM | POA: Diagnosis not present

## 2015-11-08 DIAGNOSIS — Y999 Unspecified external cause status: Secondary | ICD-10-CM | POA: Diagnosis not present

## 2015-11-08 DIAGNOSIS — Y939 Activity, unspecified: Secondary | ICD-10-CM | POA: Insufficient documentation

## 2015-11-08 DIAGNOSIS — Y9289 Other specified places as the place of occurrence of the external cause: Secondary | ICD-10-CM | POA: Insufficient documentation

## 2015-11-08 DIAGNOSIS — Z79899 Other long term (current) drug therapy: Secondary | ICD-10-CM | POA: Insufficient documentation

## 2015-11-08 DIAGNOSIS — M542 Cervicalgia: Secondary | ICD-10-CM | POA: Insufficient documentation

## 2015-11-08 DIAGNOSIS — M791 Myalgia: Secondary | ICD-10-CM | POA: Diagnosis present

## 2015-11-08 LAB — CBG MONITORING, ED: GLUCOSE-CAPILLARY: 90 mg/dL (ref 65–99)

## 2015-11-08 NOTE — Discharge Instructions (Signed)
Return to the ED with any concerns including fainting, chest pain, difficulty breathing, decreased level of alertness/lethargy, or any other alarming symptoms °

## 2015-11-08 NOTE — ED Notes (Addendum)
[facility] [employer] notified of need for transport of pt back to residence.  

## 2015-11-08 NOTE — ED Provider Notes (Signed)
CSN: [id number]     Arrival date & time 11/08/15  0242 History  By signing my name below, I, Bethany Mcfarland, attest that this documentation has been prepared under the direction and in the presence of [name], MD. Electronically Signed: Rosario AdieWilliam Andrew Mcfarland, ED Scribe. 11/08/2015. 3:45 AM.   Chief Complaint  Patient presents with  . Fall   LEVEL 5 CAVEAT: HPI and ROS limited due to dementia   The history is provided by the patient, the nursing home and the EMS personnel. The history is limited by the condition of the patient. No language interpreter was used.   HPI Comments: Marta AntuGertrude C Mcfarland is a 82 y.o. female [facility] with a PMHx of Alzheimer's dementia, HTN, Hypokalemia, DJD, and peripheral neuropathy who presents to the Emergency Department complaining of sudden onset back pain and neck pain s/p ground level fall that occurred PTA. The nursing facility notes that the fall was unwitnessed. EMS applied a towel roll for spinal stabilization en route. Pt is not currently on any blood thinners.  Pt does c/o some pain in neck.   Past Medical History  Diagnosis Date  . Dementia   . Alzheimer's dementia   . Thyroid disease   . Hypertension   . Hypokalemia   . DJD (degenerative joint disease)   . Peripheral neuropathy (HCC)    History reviewed. No pertinent past surgical history. History reviewed. No pertinent family history. Social History  Substance Use Topics  . Smoking status: Never Smoker   . Smokeless tobacco: Never Used  . Alcohol Use: No   OB History    No data available     Review of Systems  Unable to perform ROS: Dementia  Musculoskeletal: Positive for myalgias, back pain and neck pain.   Allergies  Review of patient's allergies indicates no known allergies.  Home Medications   Prior to Admission medications   Medication Sig Start Date End Date Taking? Authorizing Provider  acetaminophen (TYLENOL) 500 MG  tablet Take 500 mg by mouth 2 (two) times daily.    Yes Historical Provider, MD  alum & mag hydroxide-simeth (MAALOX/MYLANTA) 200-200-20 MG/5ML suspension Take 30 mLs by mouth 4 (four) times daily as needed for indigestion or heartburn.   Yes Historical Provider, MD  atenolol (TENORMIN) 50 MG tablet Take 50 mg by mouth every morning.    Yes Historical Provider, MD  guaifenesin (ROBITUSSIN) 100 MG/5ML syrup Take 200 mg by mouth every 6 (six) hours as needed for cough.   Yes Historical Provider, MD  levothyroxine (SYNTHROID, LEVOTHROID) 88 MCG tablet Take 88 mcg by mouth daily before breakfast.   Yes Historical Provider, MD  lisinopril (PRINIVIL,ZESTRIL) 10 MG tablet Take 10 mg by mouth daily.   Yes Historical Provider, MD  loperamide (IMODIUM) 2 MG capsule Take 2 mg by mouth as needed for diarrhea or loose stools.   Yes Historical Provider, MD  magnesium hydroxide (MILK OF MAGNESIA) 400 MG/5ML suspension Take 30 mLs by mouth daily as needed for mild constipation.   Yes Historical Provider, MD  rivastigmine (EXELON) 4.5 MG capsule Take 4.5 mg by mouth 2 (two) times daily.   Yes Historical Provider, MD  traZODone (DESYREL) 50 MG tablet Take 25 mg by mouth at bedtime as needed for sleep.    Yes Historical Provider, MD  Triamcinolone Acetonide (TRIAMCINOLONE 0.1 % CREAM : EUCERIN) CREA Apply 1 application topically 2 (two) times daily as needed for rash.    Yes  Historical Provider, MD   BP 138/57 mmHg  Pulse 69  Temp(Src) 97.5 F (36.4 C) (Oral)  Resp 20  SpO2 95%  Vitals reviewed Physical Exam  Physical Examination: General appearance - alert, frail appearing, and in no distress Mental status - alert, oriented to person not to place or time Eyes - pupils equal and reactive, extraocular eye movements intact Head- NCAT Mouth - mucous membranes moist, pharynx normal without lesions Neck - mild tenderness to palpation diffusely of cervical spine Chest - clear to auscultation, no wheezes, rales or  rhonchi, symmetric air entry Heart - normal rate, regular rhythm, normal S1, S2, no murmurs, rubs, clicks or gallops Abdomen - soft, nontender, nondistended, no masses or organomegaly Back exam - no midline tenderness of thoracic or lumbar spine Neurological - alert, oriented x 1, normal speech, moving all extremities Musculoskeletal - no joint tenderness, deformity or swelling, FROM of joints of upper and lower extremities without pain Extremities - peripheral pulses normal, no pedal edema, no clubbing or cyanosis Skin - normal coloration and turgor, no rashes  ED Course  Procedures (including critical care time)  DIAGNOSTIC STUDIES: Oxygen Saturation is 100% on RA, normal by my interpretation.   COORDINATION OF CARE: 3:45 AM-Discussed next steps with pt including DG C-spine.  Labs Review Labs Reviewed  CBG MONITORING, ED    Imaging Review Dg Cervical Spine Complete  11/08/2015  CLINICAL DATA:  Unwitnessed fall this morning.  Posterior neck pain. EXAM: CERVICAL SPINE - COMPLETE 4+ VIEW COMPARISON:  CT cervical spine 04/26/2013 FINDINGS: Normal alignment of the cervical spine. Degenerative changes with narrowed interspaces and endplate hypertrophic changes. Degenerative changes in the facet joints. No vertebral compression deformities. No prevertebral soft tissue swelling. No focal bone lesion or bone destruction. C1-2 articulation appears intact. IMPRESSION: Degenerative changes in the cervical spine. Normal alignment. No acute displaced fractures identified. Electronically Signed   By: Burman Nieves M.D.   On: 11/08/2015 04:36    I have personally reviewed and evaluated these images and lab results as part of my medical decision-making.  MDM   Final diagnoses:  Fall, initial encounter    Pt presenting after fall at her nursing facility.  She does c/o some neck pain.  No signs of head trauma.  Pt is at her baseline per EMS.  Xray reassuring.  Pt is up and ambulating in the  halls without difficulty.  No further complaints of pain.  Discharged with strict return precautions.  Pt agreeable with plan.   I personally performed the services described in this documentation, which was scribed in my presence. The recorded information has been reviewed and is accurate.     Jerelyn Scott, MD 11/08/15 [phone]

## 2015-11-08 NOTE — ED Notes (Signed)
Patient transported to X-ray 

## 2015-11-08 NOTE — ED Notes (Signed)
Bed: RESA Expected date:  Expected time:  Means of arrival:  Comments: EMS 82 yo female fall unwitnessed/alzheimers/full body pain-no blood thinners-from facility

## 2015-11-08 NOTE — ED Notes (Signed)
[facility]

## 2015-11-08 NOTE — ED Notes (Signed)
[facility] from unwitnessed fall. No obvious trauma. Towel roll applied for spinal stabilization.

## 2015-11-12 ENCOUNTER — Emergency Department (HOSPITAL_COMMUNITY): Payer: Medicare Other

## 2015-11-12 ENCOUNTER — Encounter (HOSPITAL_COMMUNITY): Payer: Self-pay | Admitting: *Deleted

## 2015-11-12 ENCOUNTER — Emergency Department (HOSPITAL_COMMUNITY)
Admission: EM | Admit: 2015-11-12 | Discharge: 2015-11-26 | Disposition: E | Payer: Medicare Other | Attending: Emergency Medicine | Admitting: Emergency Medicine

## 2015-11-12 DIAGNOSIS — Y9289 Other specified places as the place of occurrence of the external cause: Secondary | ICD-10-CM | POA: Diagnosis not present

## 2015-11-12 DIAGNOSIS — A419 Sepsis, unspecified organism: Secondary | ICD-10-CM

## 2015-11-12 DIAGNOSIS — S8001XA Contusion of right knee, initial encounter: Secondary | ICD-10-CM | POA: Diagnosis not present

## 2015-11-12 DIAGNOSIS — Y999 Unspecified external cause status: Secondary | ICD-10-CM | POA: Insufficient documentation

## 2015-11-12 DIAGNOSIS — F0281 Dementia in other diseases classified elsewhere with behavioral disturbance: Secondary | ICD-10-CM | POA: Insufficient documentation

## 2015-11-12 DIAGNOSIS — S60011A Contusion of right thumb without damage to nail, initial encounter: Secondary | ICD-10-CM | POA: Diagnosis not present

## 2015-11-12 DIAGNOSIS — I10 Essential (primary) hypertension: Secondary | ICD-10-CM | POA: Diagnosis not present

## 2015-11-12 DIAGNOSIS — S0083XA Contusion of other part of head, initial encounter: Secondary | ICD-10-CM | POA: Diagnosis not present

## 2015-11-12 DIAGNOSIS — W19XXXA Unspecified fall, initial encounter: Secondary | ICD-10-CM | POA: Insufficient documentation

## 2015-11-12 DIAGNOSIS — R0682 Tachypnea, not elsewhere classified: Secondary | ICD-10-CM | POA: Insufficient documentation

## 2015-11-12 DIAGNOSIS — Y939 Activity, unspecified: Secondary | ICD-10-CM | POA: Diagnosis not present

## 2015-11-12 DIAGNOSIS — I4891 Unspecified atrial fibrillation: Secondary | ICD-10-CM | POA: Diagnosis not present

## 2015-11-12 DIAGNOSIS — G308 Other Alzheimer's disease: Secondary | ICD-10-CM | POA: Insufficient documentation

## 2015-11-12 DIAGNOSIS — S0993XA Unspecified injury of face, initial encounter: Secondary | ICD-10-CM | POA: Diagnosis present

## 2015-11-12 LAB — CBC WITH DIFFERENTIAL/PLATELET
BASOS PCT: 0 %
Basophils Absolute: 0 10*3/uL (ref 0.0–0.1)
EOS PCT: 0 %
Eosinophils Absolute: 0 10*3/uL (ref 0.0–0.7)
HEMATOCRIT: 38.8 % (ref 36.0–46.0)
Hemoglobin: 12.6 g/dL (ref 12.0–15.0)
Lymphocytes Relative: 15 %
Lymphs Abs: 3 10*3/uL (ref 0.7–4.0)
MCH: 32 pg (ref 26.0–34.0)
MCHC: 32.5 g/dL (ref 30.0–36.0)
MCV: 98.5 fL (ref 78.0–100.0)
MONO ABS: 1.2 10*3/uL — AB (ref 0.1–1.0)
Monocytes Relative: 6 %
NEUTROS PCT: 79 %
Neutro Abs: 15.9 10*3/uL — ABNORMAL HIGH (ref 1.7–7.7)
Platelets: 204 10*3/uL (ref 150–400)
RBC: 3.94 MIL/uL (ref 3.87–5.11)
RDW: 14 % (ref 11.5–15.5)
WBC: 20.1 10*3/uL — ABNORMAL HIGH (ref 4.0–10.5)

## 2015-11-12 LAB — BLOOD GAS, VENOUS

## 2015-11-12 LAB — COMPREHENSIVE METABOLIC PANEL
ALBUMIN: 3.3 g/dL — AB (ref 3.5–5.0)
ALT: 23 U/L (ref 14–54)
AST: 93 U/L — AB (ref 15–41)
Alkaline Phosphatase: 49 U/L (ref 38–126)
Anion gap: 21 — ABNORMAL HIGH (ref 5–15)
BUN: 16 mg/dL (ref 6–20)
CHLORIDE: 109 mmol/L (ref 101–111)
CO2: 7 mmol/L — AB (ref 22–32)
CREATININE: 1.38 mg/dL — AB (ref 0.44–1.00)
Calcium: 8.9 mg/dL (ref 8.9–10.3)
GFR calc non Af Amer: 31 mL/min — ABNORMAL LOW (ref >60)
GFR, EST AFRICAN AMERICAN: 36 mL/min — AB (ref >60)
Glucose, Bld: 407 mg/dL — ABNORMAL HIGH (ref 65–99)
Potassium: 4 mmol/L (ref 3.5–5.1)
SODIUM: 137 mmol/L (ref 135–145)
Total Bilirubin: 0.8 mg/dL (ref 0.3–1.2)
Total Protein: 6.4 g/dL — ABNORMAL LOW (ref 6.5–8.1)

## 2015-11-12 LAB — I-STAT VENOUS BLOOD GAS, ED
Acid-base deficit: 21 mmol/L — ABNORMAL HIGH (ref 0.0–2.0)
Bicarbonate: 7.4 mEq/L — ABNORMAL LOW (ref 20.0–24.0)
O2 SAT: 79 %
PCO2 VEN: 24.3 mmHg — AB (ref 45.0–50.0)
PO2 VEN: 58 mmHg — AB (ref 31.0–45.0)
TCO2: 8 mmol/L (ref 0–100)
pH, Ven: 7.088 — CL (ref 7.250–7.300)

## 2015-11-12 LAB — I-STAT TROPONIN, ED: TROPONIN I, POC: 25.38 ng/mL — AB (ref 0.00–0.08)

## 2015-11-12 LAB — BRAIN NATRIURETIC PEPTIDE: B NATRIURETIC PEPTIDE 5: 1330.2 pg/mL — AB (ref 0.0–100.0)

## 2015-11-12 LAB — I-STAT CG4 LACTIC ACID, ED: Lactic Acid, Venous: 13.35 mmol/L (ref 0.5–1.9)

## 2015-11-12 MED ORDER — SODIUM CHLORIDE 0.9 % IV BOLUS (SEPSIS): 500.0000 mL | Freq: Once | INTRAVENOUS | Status: DC

## 2015-11-12 MED ORDER — SODIUM CHLORIDE 0.9 % IV BOLUS (SEPSIS)
1000.0000 mL | Freq: Once | INTRAVENOUS | Status: AC
  Administered 2015-11-12: 1000 mL via INTRAVENOUS

## 2015-11-12 MED ORDER — ONDANSETRON HCL 4 MG/2ML IJ SOLN
4.0000 mg | Freq: Once | INTRAMUSCULAR | Status: AC
  Administered 2015-11-12: 4 mg via INTRAVENOUS

## 2015-11-12 MED ORDER — SODIUM CHLORIDE 0.9 % IV SOLN
INTRAVENOUS | Status: DC
  Filled 2015-11-12: qty 2.5

## 2015-11-12 MED ORDER — ONDANSETRON HCL 4 MG/2ML IJ SOLN
INTRAMUSCULAR | Status: AC
  Filled 2015-11-12: qty 2

## 2015-11-12 MED ORDER — DEXTROSE 5 % IV SOLN
300.0000 mg | INTRAVENOUS | Status: AC | PRN
  Administered 2015-11-12: 300 mg via INTRAVENOUS

## 2015-11-12 MED ORDER — EPINEPHRINE HCL 0.1 MG/ML IJ SOSY
PREFILLED_SYRINGE | INTRAMUSCULAR | Status: AC | PRN
  Administered 2015-11-12 (×4): 1 via INTRAVENOUS

## 2015-11-12 MED ORDER — PIPERACILLIN-TAZOBACTAM 3.375 G IVPB 30 MIN
3.3750 g | Freq: Once | INTRAVENOUS | Status: AC
  Administered 2015-11-12: 3.375 g via INTRAVENOUS
  Filled 2015-11-12: qty 50

## 2015-11-12 MED ORDER — ASPIRIN 81 MG PO CHEW: 324.0000 mg | CHEWABLE_TABLET | Freq: Once | ORAL | Status: DC

## 2015-11-12 MED ORDER — SODIUM BICARBONATE 8.4 % IV SOLN
INTRAVENOUS | Status: AC | PRN
  Administered 2015-11-12: 50 meq via INTRAVENOUS

## 2015-11-12 MED ORDER — DEXTROSE-NACL 5-0.45 % IV SOLN: INTRAVENOUS | Status: DC

## 2015-11-12 MED ORDER — CALCIUM CHLORIDE 10 % IV SOLN
INTRAVENOUS | Status: AC | PRN
  Administered 2015-11-12: 1 g via INTRAVENOUS

## 2015-11-12 MED ORDER — VANCOMYCIN HCL IN DEXTROSE 1-5 GM/200ML-% IV SOLN
1000.0000 mg | Freq: Once | INTRAVENOUS | Status: AC
  Administered 2015-11-12: 1000 mg via INTRAVENOUS
  Filled 2015-11-12: qty 200

## 2015-11-12 MED FILL — Medication: Qty: 1 | Status: AC

## 2015-11-13 LAB — BLOOD CULTURE ID PANEL (REFLEXED)
ACINETOBACTER BAUMANNII: NOT DETECTED
CANDIDA ALBICANS: NOT DETECTED
Candida glabrata: NOT DETECTED
Candida krusei: NOT DETECTED
Candida parapsilosis: NOT DETECTED
Candida tropicalis: NOT DETECTED
Carbapenem resistance: NOT DETECTED
ENTEROBACTER CLOACAE COMPLEX: NOT DETECTED
ENTEROBACTERIACEAE SPECIES: NOT DETECTED
ENTEROCOCCUS SPECIES: NOT DETECTED
Escherichia coli: NOT DETECTED
HAEMOPHILUS INFLUENZAE: NOT DETECTED
KLEBSIELLA PNEUMONIAE: NOT DETECTED
Klebsiella oxytoca: NOT DETECTED
Listeria monocytogenes: NOT DETECTED
METHICILLIN RESISTANCE: NOT DETECTED
NEISSERIA MENINGITIDIS: NOT DETECTED
PSEUDOMONAS AERUGINOSA: NOT DETECTED
Proteus species: NOT DETECTED
STAPHYLOCOCCUS AUREUS BCID: NOT DETECTED
STREPTOCOCCUS AGALACTIAE: NOT DETECTED
STREPTOCOCCUS PNEUMONIAE: NOT DETECTED
STREPTOCOCCUS PYOGENES: NOT DETECTED
STREPTOCOCCUS SPECIES: NOT DETECTED
Serratia marcescens: NOT DETECTED
Staphylococcus species: DETECTED — AB
VANCOMYCIN RESISTANCE: NOT DETECTED

## 2015-11-14 LAB — URINE CULTURE: Culture: >=100000 — AB

## 2015-11-15 ENCOUNTER — Telehealth: Payer: Self-pay | Admitting: *Deleted

## 2015-11-15 LAB — CULTURE, BLOOD (ROUTINE X 2)

## 2015-11-16 ENCOUNTER — Telehealth (HOSPITAL_BASED_OUTPATIENT_CLINIC_OR_DEPARTMENT_OTHER): Payer: Self-pay

## 2015-11-16 NOTE — Telephone Encounter (Signed)
+   blood culture:  PT deceased

## 2015-11-17 LAB — CULTURE, BLOOD (ROUTINE X 2): Culture: NO GROWTH

## 2015-11-26 NOTE — ED Notes (Signed)
Chaplain at bedside able to contact son for next of kin. Family enroute to the hospital

## 2015-11-26 NOTE — Progress Notes (Signed)
Chaplain responded to page that pt in A3 was coding. Pt did not survive the code. Pt had come by EMS from local care facility where she resided. Dr. Clarene DukeLittle asked chaplain to contact pt's son. I reached son's wife and she said they would come. When they arrived I accompanied them to consult B and brought Dr. Clarene DukeLittle to update them. She informed them that pt had passed. I accompanied pt's son and his wife to A3 to spend time with pt. Afterwards I got sons's contact info and funeral home name and gave to RN. Pt's son's wife said she would inform the nursing facility of pt's passing. I accompanied pt's son and his wife out of the department and they expressed appreciation for chaplain support.

## 2015-11-26 NOTE — ED Notes (Signed)
[facility]. Initial blood pressure 90/50, 600ml NS given by EMS, bp improved to 100 systolic. EKG showed afib with a rate of 110-150. CBG 425. Pt cold to the touch, appears pale, large hematoma noted to R forehead. Hx of dementia, alert to self only. Pt with towel-made c-collar in place. C/o neck pain and nausea

## 2015-11-26 NOTE — ED Notes (Signed)
Lab has insufficient amount of urine for urinalysis; will re-collect at a later time

## 2015-11-26 NOTE — ED Provider Notes (Signed)
CSN: [id number]     Arrival date & time July 01, 2015  0032 History  By signing my name below, I, Bethany Mcfarland, attest that this documentation has been prepared under the direction and in the presence of Laurence Spatesachel Morgan Charnele Semple, MD. Electronically Signed: Bethel BornBritney Mcfarland, ED Scribe. 29-Sep-2015. 2:20 AM   Chief Complaint  Patient presents with  . Fall   LEVEL V CAVEAT DUE TO DEMENTIA   The history is provided by the EMS personnel. No language interpreter was used.   [facility], Marta AntuGertrude C Mcfarland is a 82 y.o. female with PMHx of dementia and HTN who presents to the Emergency Department for evaluation after an unwitnessed fall at her facility. The pt was found on the floor by staff at shift change.  Associated symptoms include bruising at the right forehead and complaints of mild diffuse pain. On EMS arrival the pt was noted to be in new onset a-fib, hyperglycemic at over 400, and hypotensive at 90/50 initially (improved to normal after 600 ml of IVF).   Past Medical History  Diagnosis Date  . Dementia   . Alzheimer's dementia   . Thyroid disease   . Hypertension   . Hypokalemia   . DJD (degenerative joint disease)   . Peripheral neuropathy (HCC)    History reviewed. No pertinent past surgical history. No family history on file. Social History  Substance Use Topics  . Smoking status: Never Smoker   . Smokeless tobacco: Never Used  . Alcohol Use: No   OB History    No data available     Review of Systems  Unable to perform ROS: Dementia      Allergies  Review of patient's allergies indicates no known allergies.  Home Medications   Prior to Admission medications   Medication Sig Start Date End Date Taking? Authorizing Provider  acetaminophen (TYLENOL) 500 MG tablet Take 500 mg by mouth 2 (two) times daily.    Yes Historical Provider, MD  acetaminophen (TYLENOL) 500 MG tablet Take 500 mg by mouth every 4 (four) hours as needed for  fever.   Yes Historical Provider, MD  alum & mag hydroxide-simeth (MAALOX/MYLANTA) 200-200-20 MG/5ML suspension Take 30 mLs by mouth 4 (four) times daily as needed for indigestion or heartburn.   Yes Historical Provider, MD  atenolol (TENORMIN) 50 MG tablet Take 50 mg by mouth every morning.    Yes Historical Provider, MD  guaifenesin (ROBITUSSIN) 100 MG/5ML syrup Take 200 mg by mouth every 6 (six) hours as needed for cough.   Yes Historical Provider, MD  levothyroxine (SYNTHROID, LEVOTHROID) 88 MCG tablet Take 88 mcg by mouth daily before breakfast.   Yes Historical Provider, MD  lisinopril (PRINIVIL,ZESTRIL) 10 MG tablet Take 10 mg by mouth daily.   Yes Historical Provider, MD  loperamide (IMODIUM) 2 MG capsule Take 2 mg by mouth as needed for diarrhea or loose stools.   Yes Historical Provider, MD  magnesium hydroxide (MILK OF MAGNESIA) 400 MG/5ML suspension Take 30 mLs by mouth daily as needed for mild constipation.   Yes Historical Provider, MD  neomycin-bacitracin-polymyxin (NEOSPORIN) OINT Apply 1 application topically as needed for wound care.   Yes Historical Provider, MD  rivastigmine (EXELON) 4.5 MG capsule Take 4.5 mg by mouth 2 (two) times daily.   Yes Historical Provider, MD  Skin Protectants, Misc. (MINERIN) CREA Apply topically 2 (two) times daily as needed (for dry skin).   Yes Historical Provider, MD  traZODone (DESYREL) 50  MG tablet Take 25 mg by mouth at bedtime as needed for sleep.    Yes Historical Provider, MD  triamcinolone cream (KENALOG) 0.1 % Apply 1 application topically 2 (two) times daily as needed.   Yes Historical Provider, MD   BP 102/71 mmHg  Pulse 121  Temp(Src) 95.8 F (35.4 C) (Rectal)  Resp 33  SpO2 100% Physical Exam  Constitutional: No distress.  Frail, elderly woman   HENT:  Head: Normocephalic.  Dry mucous membranes Ecchymosis at right forehead   Eyes: Conjunctivae are normal. Pupils are equal, round, and reactive to light.  Neck: Neck supple.   Cardiovascular: An irregularly irregular rhythm present. Tachycardia present.   Murmur heard.  Systolic murmur is present with a grade of 3/6  Pulmonary/Chest: Effort normal. Tachypnea noted. No respiratory distress. She has wheezes.  Expiratory wheezes bilaterally  Abdominal: Soft. Bowel sounds are normal. She exhibits no distension. There is no tenderness.  Musculoskeletal: She exhibits no edema.  Ecchymosis right thumb 4 cm skin tear with ecchymosis at right knee   Neurological: She is alert.  Eyes closed but opens to voice Follows basic commands Moves all four extremities  Oriented to person   Skin: Skin is warm and dry.  Multiple scabs and excoriations on arms, legs, and abdomen   Nursing note and vitals reviewed.   ED Course  .Critical Care Performed by: Laurence Spates Authorized by: Laurence Spates Total critical care time: 75 minutes Critical care time was exclusive of separately billable procedures and treating other patients. Critical care was necessary to treat or prevent imminent or life-threatening deterioration of the following conditions: sepsis, cardiac failure and endocrine crisis. Critical care was time spent personally by me on the following activities: discussions with consultants, evaluation of patient's response to treatment, examination of patient, ordering and performing treatments and interventions, obtaining history from patient or surrogate, ordering and review of laboratory studies, ordering and review of radiographic studies, re-evaluation of patient's condition and review of old charts.   (including critical care time)  Cardiopulmonary Resuscitation (CPR) Procedure Note Directed/Performed by: Ambrose Finland Malijah Lietz I personally directed ancillary staff and/or performed CPR in an effort to regain return of spontaneous circulation and to maintain cardiac, neuro and systemic perfusion.      COORDINATION OF CARE:  2:12 AM-Consult complete  with Dr. Kendrick Fries (Intensivist). Patient case explained and discussed. He will evaluate the pt. Call ended at 2:16 AM   Labs Review Labs Reviewed  COMPREHENSIVE METABOLIC PANEL - Abnormal; Notable for the following:    CO2 7 (*)    Glucose, Bld 407 (*)    Creatinine, Ser 1.38 (*)    Total Protein 6.4 (*)    Albumin 3.3 (*)    AST 93 (*)    GFR calc non Af Amer 31 (*)    GFR calc Af Amer 36 (*)    Anion gap 21 (*)    All other components within normal limits  CBC WITH DIFFERENTIAL/PLATELET - Abnormal; Notable for the following:    WBC 20.1 (*)    Neutro Abs 15.9 (*)    Monocytes Absolute 1.2 (*)    All other components within normal limits  BRAIN NATRIURETIC PEPTIDE - Abnormal; Notable for the following:    B Natriuretic Peptide 1330.2 (*)    All other components within normal limits  I-STAT CG4 LACTIC ACID, ED - Abnormal; Notable for the following:    Lactic Acid, Venous 13.35 (*)    All other components within  normal limits  I-STAT TROPOININ, ED - Abnormal; Notable for the following:    Troponin i, poc 25.38 (*)    All other components within normal limits  I-STAT VENOUS BLOOD GAS, ED - Abnormal; Notable for the following:    pH, Ven 7.088 (*)    pCO2, Ven 24.3 (*)    pO2, Ven 58.0 (*)    Bicarbonate 7.4 (*)    Acid-base deficit 21.0 (*)    All other components within normal limits  URINE CULTURE  CULTURE, BLOOD (ROUTINE X 2)  CULTURE, BLOOD (ROUTINE X 2) W REFLEX TO ID PANEL  BLOOD GAS, VENOUS    Imaging Review Ct Head Wo Contrast  11-28-15  CLINICAL DATA:  Unwitnessed fall with forehead hematoma. EXAM: CT HEAD WITHOUT CONTRAST CT CERVICAL SPINE WITHOUT CONTRAST TECHNIQUE: Multidetector CT imaging of the head and cervical spine was performed following the standard protocol without intravenous contrast. Multiplanar CT image reconstructions of the cervical spine were also generated. COMPARISON:  04/26/2013 FINDINGS: Best obtainable exam is motion degraded and could  obscure pathology. Multiple straps were used to restrain the patient but not completely effective. CT HEAD FINDINGS Brain: No evidence of acute infarction, hemorrhage, hydrocephalus, or mass lesion/mass effect. Generalized volume loss and chronic small vessel disease that is mild to moderate for age. Vascular: Lucency in the superior sagittal sinus attributed to intravenous gas from IV access. Skull: Forehead hematoma without acute fracture. Sinuses/Orbits: No acute finding. Other: None. CT CERVICAL SPINE FINDINGS Negative for acute fracture or subluxation. No prevertebral edema. No gross cervical canal hematoma. Mild edema suspected in the apical lungs. IMPRESSION: 1. Diffusely motion degraded exam, nondiagnostic at some levels. 2. No evidence of intracranial or cervical spine injury. 3. Forehead hematoma without fracture. Electronically Signed   By: Marnee Spring M.D.   On: 11-28-15 02:07   Ct Cervical Spine Wo Contrast  November 28, 2015  CLINICAL DATA:  Unwitnessed fall with forehead hematoma. EXAM: CT HEAD WITHOUT CONTRAST CT CERVICAL SPINE WITHOUT CONTRAST TECHNIQUE: Multidetector CT imaging of the head and cervical spine was performed following the standard protocol without intravenous contrast. Multiplanar CT image reconstructions of the cervical spine were also generated. COMPARISON:  04/26/2013 FINDINGS: Best obtainable exam is motion degraded and could obscure pathology. Multiple straps were used to restrain the patient but not completely effective. CT HEAD FINDINGS Brain: No evidence of acute infarction, hemorrhage, hydrocephalus, or mass lesion/mass effect. Generalized volume loss and chronic small vessel disease that is mild to moderate for age. Vascular: Lucency in the superior sagittal sinus attributed to intravenous gas from IV access. Skull: Forehead hematoma without acute fracture. Sinuses/Orbits: No acute finding. Other: None. CT CERVICAL SPINE FINDINGS Negative for acute fracture or subluxation.  No prevertebral edema. No gross cervical canal hematoma. Mild edema suspected in the apical lungs. IMPRESSION: 1. Diffusely motion degraded exam, nondiagnostic at some levels. 2. No evidence of intracranial or cervical spine injury. 3. Forehead hematoma without fracture. Electronically Signed   By: Marnee Spring M.D.   On: 28-Nov-2015 02:07   Dg Chest Port 1 View  November 28, 2015  CLINICAL DATA:  Shortness of breath.  Fall. EXAM: PORTABLE CHEST 1 VIEW COMPARISON:  April 26, 2013 FINDINGS: The heart size borderline. Increased interstitial markings consistent mild edema. No pneumothorax. No other acute abnormalities identified. IMPRESSION: Mild edema.  Recommend follow-up to resolution. Electronically Signed   By: Gerome Sam III M.D   On: 11/28/15 01:40   Dg Knee Complete 4 Views Right  28-Nov-2015  CLINICAL DATA:  Trauma. EXAM: RIGHT KNEE - COMPLETE 4+ VIEW COMPARISON:  July 14, 2010 FINDINGS: Sclerosis in the distal femur consistent with an enchondroma or bony infarct, unchanged. No fractures or dislocations. No joint effusion. IMPRESSION: No acute abnormalities. Electronically Signed   By: Gerome Sam III M.D   On: 2015/11/27 02:23   I have personally reviewed and evaluated these images and lab results as part of my medical decision-making.   EKG Interpretation   Date/Time:  Tuesday 11/27/15 00:39:07 EDT Ventricular Rate:  131 PR Interval:    QRS Duration: 90 QT Interval:  334 QTC Calculation: 494 R Axis:   -67 Text Interpretation:  Atrial flutter with predominant 2:1 AV block  Inferior infarct, old Probable anterior infarct, age indeterminate A fib  new from previous Confirmed by Stormey Wilborn MD, Katey Barrie [phone]) on 11-27-15  1:09:54 AM     Medications  piperacillin-tazobactam (ZOSYN) IVPB 3.375 g (0 g Intravenous Stopped 11/27/2015 0220)  vancomycin (VANCOCIN) IVPB 1000 mg/200 mL premix (0 mg Intravenous Stopped 11-27-2015 0239)  sodium chloride 0.9 % bolus 1,000 mL (0 mLs  Intravenous Stopped 11/27/2015 0239)  ondansetron (ZOFRAN) injection 4 mg (4 mg Intravenous Given 11/27/15 0220)  EPINEPHrine (ADRENALIN) 0.1 MG/ML injection (1 Syringe Intravenous Given 27-Nov-2015 0235)  sodium bicarbonate injection (50 mEq Intravenous Given 27-Nov-2015 0229)  calcium chloride injection (1 g Intravenous Given 2015/11/27 0230)  amiodarone (CORDARONE) 300 mg in dextrose 5 % 100 mL bolus (0 mg Intravenous Stopped November 27, 2015 0254)    MDM   Final diagnoses:  Sepsis, due to unspecified organism [facility])  Atrial fibrillation with rapid ventricular response (HCC)   Patient brought in from nursing facility for unwitnessed fall. EMS noted when they picked her up that her blood glucose was over 400, she was hypotensive and in atrial fibrillation with RVR. She improved with a small fluid bolus. On arrival, she was able to follow basic commands, oriented to person only. She had a forehead hematoma and skin tear on her knee, no other obvious injuries. No abdominal tenderness. She had a temp of 95.8 rectally and given her abnormal vital signs, initiated a code sepsis with blood and urine cultures, broad-spectrum antibiotics, above lab work, and a 1 L fluid bolus to complete a 69ml/kg bolus. I suspect that the patient's atrial fibrillation is secondary to sepsis.   Obtained CT of head and C-spine which were negative for acute injury. Initial lab work showed lactate of 13.3, venous pH 7.09, CO2 24, troponin 25, creatinine 1.38, AG 21, WBC 20.1. After receiving the patient's lab work, contacted critical care and discussed with Dr. Kendrick Fries, who recommended no further therapies then she was currently receiving and immediate contact a family to discuss goals of care given the patient's grave prognosis with her severe lactic acidosis and NSTEMI. Emergency contacts listed were husband and daughter; through phone calls I discovered that both are deceased. As I was obtaining contact information from her son-in-law for her son,  who is next of kin, I was called to the room due to cardiac arrest that occurred shortly after getting back from CT scan. CPR was initiated and patient given epi x2, calcium, bicarb. Later noted V fib on monitor; defibrillated x 1 and gave amiodarone  then . ETT placed by respiratory therapy. The patient's rhythm degenerated to asystole and given her severe lab abnormalities and advanced age, terminated efforts due to futility. Time of death 2:37am. Spoke with chaplain who was able to contact patient's son.   I discussed the  patient's course with her son and his wife and all questions answered. They were left in care of chaplain. I discussed case w/ medical examiner, Dr. Hyacinth Meeker, who declined the case.  I personally performed the services described in this documentation, which was scribed in my presence. The recorded information has been reviewed and is accurate.   Laurence Spates, MD 11-26-2015 [phone]

## 2015-11-26 NOTE — ED Notes (Signed)
Pt's pulse rate 70, then 60; agonal and pulseless, CPR started

## 2015-11-26 NOTE — Code Documentation (Addendum)
Patient time of death occurred at [id number].

## 2015-11-26 NOTE — ED Notes (Signed)
[facility]: [employer] [address]  Contact information for patient:  Mignon PineSon - Dave Gohr [phone] [address] [place], [place] [address]  Daughter-in-law, Carrolyn LeighCindy Nancy [phone]  Call Arline Aspindy first! She will get Theodoro GristDave to the phone

## 2015-11-26 NOTE — Progress Notes (Signed)
Time of death at 0237 per MD Little.

## 2015-11-26 NOTE — Procedures (Signed)
Intubation Procedure Note Marta AntuGertrude C Azimi [id number] 13-Sep-1918  Procedure: Intubation Indications: Respiratory insufficiency  Procedure Details Consent: Unable to obtain consent because of emergent medical necessity. Time Out: Verified patient identification, verified procedure, site/side was marked, verified correct patient position, special equipment/implants available, medications/allergies/relevent history reviewed, required imaging and test results available.  Performed  Maximum sterile technique was used including antiseptics, gloves and hand hygiene.  MAC and 3    Evaluation Hemodynamic Status: BP stable throughout; O2 sats: stable throughout Patient's Current Condition: unstable "Active CPR in progress" Code Blue initiated  Complications: No apparent complications Patient did tolerate procedure well. Chest X-ray ordered to verify placement.  CXR: pending.  Pt was intubated per Emergency Medically Necessary, CPR in progress. Pt has agonal respirations. MD at bedside.  Benjamine SpragueMaurice L Malikah Lakey, BS, RCP, RRT Dec 21, 2015

## 2015-11-26 NOTE — ED Notes (Addendum)
In an out cath completed with three RNs; ~2cc of cloudy urine returned.

## 2015-11-26 NOTE — ED Notes (Signed)
Pt more restless, attempting to climb out of the bed, c/o nausea

## 2015-11-26 DEATH — deceased
